# Patient Record
Sex: Male | Born: 1960 | Race: White | Hispanic: No | Smoking: Former smoker
Health system: Southern US, Community
[De-identification: ages and names within clinical notes are randomized; demographics above are authoritative.]

## PROBLEM LIST (undated history)

## (undated) DIAGNOSIS — K760 Fatty (change of) liver, not elsewhere classified: Secondary | ICD-10-CM

## (undated) DIAGNOSIS — Z87442 Personal history of urinary calculi: Secondary | ICD-10-CM

## (undated) DIAGNOSIS — IMO0001 Reserved for inherently not codable concepts without codable children: Secondary | ICD-10-CM

## (undated) DIAGNOSIS — N529 Male erectile dysfunction, unspecified: Secondary | ICD-10-CM

## (undated) DIAGNOSIS — K219 Gastro-esophageal reflux disease without esophagitis: Secondary | ICD-10-CM

## (undated) DIAGNOSIS — M1990 Unspecified osteoarthritis, unspecified site: Secondary | ICD-10-CM

## (undated) DIAGNOSIS — Z8739 Personal history of other diseases of the musculoskeletal system and connective tissue: Secondary | ICD-10-CM

## (undated) DIAGNOSIS — G5600 Carpal tunnel syndrome, unspecified upper limb: Secondary | ICD-10-CM

## (undated) HISTORY — DX: Fatty (change of) liver, not elsewhere classified: K76.0

## (undated) HISTORY — PX: BACK SURGERY: SHX140

## (undated) HISTORY — DX: Male erectile dysfunction, unspecified: N52.9

## (undated) HISTORY — DX: Personal history of other diseases of the musculoskeletal system and connective tissue: Z87.39

## (undated) HISTORY — PX: VASECTOMY: SHX75

## (undated) HISTORY — PX: SHOULDER ARTHROSCOPY: SHX128

## (undated) HISTORY — PX: CYSTOSCOPY: SUR368

## (undated) HISTORY — PX: NASAL POLYP SURGERY: SHX186

## (undated) HISTORY — PX: CHOLECYSTECTOMY: SHX55

---

## 2000-05-03 ENCOUNTER — Ambulatory Visit (HOSPITAL_COMMUNITY): Admission: RE | Admit: 2000-05-03 | Discharge: 2000-05-04 | Payer: Self-pay | Admitting: Neurosurgery

## 2000-05-03 ENCOUNTER — Encounter: Payer: Self-pay | Admitting: Neurosurgery

## 2003-01-23 ENCOUNTER — Inpatient Hospital Stay (HOSPITAL_COMMUNITY): Admission: EM | Admit: 2003-01-23 | Discharge: 2003-01-24 | Payer: Self-pay | Admitting: Internal Medicine

## 2003-02-05 ENCOUNTER — Ambulatory Visit (HOSPITAL_COMMUNITY): Admission: RE | Admit: 2003-02-05 | Discharge: 2003-02-05 | Payer: Self-pay | Admitting: Family Medicine

## 2003-02-20 ENCOUNTER — Emergency Department (HOSPITAL_COMMUNITY): Admission: EM | Admit: 2003-02-20 | Discharge: 2003-02-20 | Payer: Self-pay | Admitting: Emergency Medicine

## 2003-02-28 ENCOUNTER — Observation Stay (HOSPITAL_COMMUNITY): Admission: RE | Admit: 2003-02-28 | Discharge: 2003-03-01 | Payer: Self-pay | Admitting: Urology

## 2004-02-27 IMAGING — RF DG RETROGRADE PYELOGRAM
1 series · 3 of 3 positions shown · non-contrast
Comparison: none

CLINICAL DATA: Left distal ureteral calculus.  
 LEFT RETROGRADE PYELOGRAM
 Images were obtained of the distal most left ureter, which demonstrated no abnormality.  However, the mid and proximal left ureter and left pelvicaliceal system are not visualized on this study.
 IMPRESSION
 As above.

[Series 753: run · 3 of 3 slices shown]
[im 1/3]
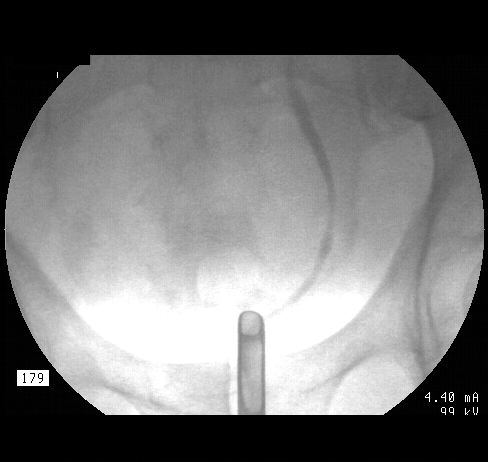
[im 2/3]
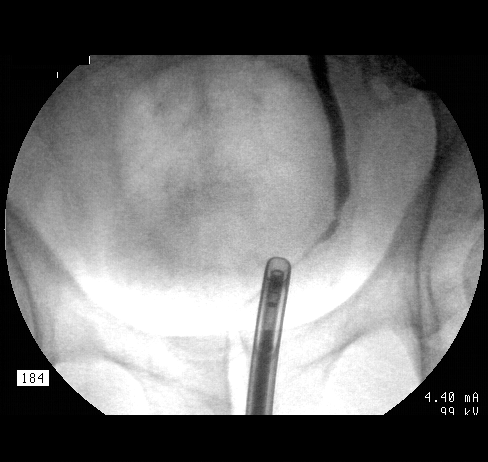
[im 3/3]
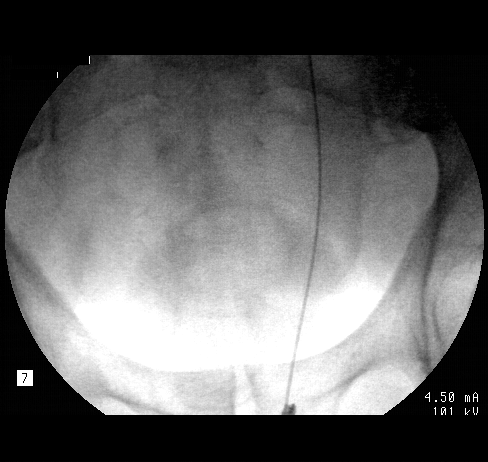

[3 of 3 positions shown; findings below may reference images not displayed]

## 2004-10-03 ENCOUNTER — Emergency Department (HOSPITAL_COMMUNITY): Admission: EM | Admit: 2004-10-03 | Discharge: 2004-10-03 | Payer: Self-pay | Admitting: Emergency Medicine

## 2004-10-25 ENCOUNTER — Ambulatory Visit (HOSPITAL_COMMUNITY): Admission: RE | Admit: 2004-10-25 | Discharge: 2004-10-25 | Payer: Self-pay | Admitting: Family Medicine

## 2005-02-02 ENCOUNTER — Ambulatory Visit (HOSPITAL_COMMUNITY): Admission: RE | Admit: 2005-02-02 | Discharge: 2005-02-03 | Payer: Self-pay | Admitting: Neurosurgery

## 2008-12-28 ENCOUNTER — Emergency Department (HOSPITAL_COMMUNITY): Admission: EM | Admit: 2008-12-28 | Discharge: 2008-12-28 | Payer: Self-pay | Admitting: Emergency Medicine

## 2009-05-04 ENCOUNTER — Ambulatory Visit (HOSPITAL_COMMUNITY): Admission: RE | Admit: 2009-05-04 | Discharge: 2009-05-04 | Payer: Self-pay | Admitting: Family Medicine

## 2009-05-08 ENCOUNTER — Encounter (HOSPITAL_COMMUNITY): Admission: RE | Admit: 2009-05-08 | Discharge: 2009-06-07 | Payer: Self-pay | Admitting: Family Medicine

## 2009-06-25 ENCOUNTER — Ambulatory Visit (HOSPITAL_COMMUNITY): Admission: RE | Admit: 2009-06-25 | Discharge: 2009-06-25 | Payer: Self-pay | Admitting: Internal Medicine

## 2009-07-29 ENCOUNTER — Ambulatory Visit (HOSPITAL_COMMUNITY): Admission: RE | Admit: 2009-07-29 | Discharge: 2009-07-29 | Payer: Self-pay | Admitting: General Surgery

## 2010-03-02 ENCOUNTER — Ambulatory Visit (HOSPITAL_COMMUNITY)
Admission: RE | Admit: 2010-03-02 | Discharge: 2010-03-02 | Payer: Self-pay | Source: Home / Self Care | Attending: Urology | Admitting: Urology

## 2010-03-05 ENCOUNTER — Ambulatory Visit (HOSPITAL_COMMUNITY)
Admission: RE | Admit: 2010-03-05 | Discharge: 2010-03-05 | Payer: Self-pay | Source: Home / Self Care | Attending: Urology | Admitting: Urology

## 2010-03-06 ENCOUNTER — Encounter: Payer: Self-pay | Admitting: Urology

## 2010-03-07 ENCOUNTER — Emergency Department (HOSPITAL_COMMUNITY)
Admission: EM | Admit: 2010-03-07 | Discharge: 2010-03-07 | Payer: Self-pay | Source: Home / Self Care | Admitting: Emergency Medicine

## 2010-03-08 LAB — SURGICAL PCR SCREEN: MRSA, PCR: NEGATIVE

## 2010-03-08 LAB — BASIC METABOLIC PANEL
BUN: 15 mg/dL (ref 6–23)
Creatinine, Ser: 1.32 mg/dL (ref 0.4–1.5)
GFR calc non Af Amer: 57 mL/min — ABNORMAL LOW (ref >60)
Glucose, Bld: 93 mg/dL (ref 70–99)
Potassium: 4.3 mEq/L (ref 3.5–5.1)

## 2010-03-09 LAB — BASIC METABOLIC PANEL
BUN: 14 mg/dL (ref 6–23)
Chloride: 103 mEq/L (ref 96–112)
Glucose, Bld: 94 mg/dL (ref 70–99)
Potassium: 4 mEq/L (ref 3.5–5.1)

## 2010-03-09 LAB — CBC
HCT: 39.4 % (ref 39.0–52.0)
MCV: 88.9 fL (ref 78.0–100.0)
RBC: 4.43 MIL/uL (ref 4.22–5.81)
RDW: 12.7 % (ref 11.5–15.5)
WBC: 10.4 10*3/uL (ref 4.0–10.5)

## 2010-03-09 LAB — DIFFERENTIAL
Eosinophils Relative: 3 % (ref 0–5)
Lymphocytes Relative: 28 % (ref 12–46)
Lymphs Abs: 2.9 10*3/uL (ref 0.7–4.0)

## 2010-03-09 LAB — URINALYSIS, ROUTINE W REFLEX MICROSCOPIC
Bilirubin Urine: NEGATIVE
Ketones, ur: NEGATIVE mg/dL
Urine Glucose, Fasting: NEGATIVE mg/dL
pH: 5.5 (ref 5.0–8.0)

## 2010-03-09 LAB — URINE MICROSCOPIC-ADD ON

## 2010-03-10 LAB — URINE CULTURE: Culture  Setup Time: 201201222258

## 2010-03-11 NOTE — Consult Note (Addendum)
  Daniel Conner, Daniel Conner NO.:  [id number]  MEDICAL RECORD NO.:  [id number]          PATIENT TYPE:  AMB  LOCATION:  DAY                           FACILITY:  [facility]  PHYSICIAN:  Ky Barban, M.D.DATE OF BIRTH:  03-14-1960  DATE OF CONSULTATION: DATE OF DISCHARGE:                                CONSULTATION   CHIEF COMPLAINT:  Recurrent right flank pain.  HISTORY:  A 50 year old gentleman who is well-known to me and I have taken care of him in the past.  I have done a stone basket sometime ago and he presented with right flank pain on January 16, he was seen in the office.  He is having pain ever since and CT scan shows he has a 4-mm stone.  The right upper ureter, CT scan showed there is no stone has moved into the distal right ureter and the ureterovesical junction causing right hydroureteronephrosis.  The patient wants me to go ahead and remove the stone, although I told them it is possibly he can pass it so I am going to wait couple of days, schedule him if he passed the stone, then we can cancel the procedure, otherwise we will go ahead with the stone basket on Friday.  Procedure risks, limitation, and complications were discussed in detail especially ureteral perforation leading to open surgery.  Use of double-J stent,  he understands, want me to go out and proceed.  PAST HISTORY:  Back surgery in 2005 and again in 2003.  Cholecystectomy in 2011.  On the CT scan, there are several lymph nodes in the retroperitoneum also and I think they need to be addressed.  I told him and his wife when we get rid of the stone after that we will talk about that they are going to remind me also.  FAMILY HISTORY:  No history of prostate cancer.  PERSONAL HISTORY:  He does not smoke but drinks very little.  REVIEW OF SYSTEMS:  Unremarkable.  PHYSICAL EXAMINATION:  GENERAL:  Well-nourished, well-developed male in moderate distress. VITAL SIGNS:  Blood pressure 130/81  and temperature 98.6. CENTRAL NERVOUS SYSTEM:  Negative. HEAD, NECK ENT:  Negative. CHEST:  Symmetrical. HEART:  Regular sinus rhythm, no murmur. ABDOMEN:  Soft and flat.  Liver, spleen, and kidneys are not palpable. 1+ right CVA tenderness. EXTERNAL GENITALIA:  Unremarkable. RECTAL:  Deferred. EXTREMITIES:  Normal.  IMPRESSION:  Right distal ureteral calculus.  PLAN:  Cystoscopy, right retrograde pyelogram, UV double-J stent ureteroscopic stone basket extraction with holmium laser lithotripsy.     Ky Barban, M.D.     MIJ/MEDQ  D:  03/04/2010  T:  03/05/2010  Job:  [id number]  Electronically Signed by Alleen Borne M.D. on 03/11/2010 04:54:26 PM

## 2010-03-11 NOTE — Op Note (Addendum)
  NAMENOHA, KARASIK NO.:  [id number]  MEDICAL RECORD NO.:  [id number]          PATIENT TYPE:  AMB  LOCATION:  DAY                           FACILITY:  [facility]  PHYSICIAN:  Ky Barban, M.D.DATE OF BIRTH:  04-02-1960  DATE OF PROCEDURE:  03/05/2010 DATE OF DISCHARGE:  03/05/2010                              OPERATIVE REPORT   PREOPERATIVE DIAGNOSIS:  Right ureteral calculus.  POSTOPERATIVE DIAGNOSIS:  Right ureteral calculus.  PROCEDURES:  Cystoscopy, right retrograde pyelogram, ureteroscopic stone basket extraction.  ANESTHESIA:  General.  PROCEDURE:  The patient under general anesthesia in lithotomy position. After usual prep and drape, #25 cystoscope introduced into the bladder which was inspected, looks normal.  Right ureteral orifice catheterized with a wedge catheter.  Hypaque was injected under fluoroscopic control. There was a filling defect in the ureterovesical junction which was the stone and guidewire was passed up into the renal pelvis and intramural ureter was dilated with #15 balloon.  After dilating the ureter and short rigid, a guidewire was already in place.  A short rigid ureteroscope was passed alongside the guidewire.  Stone was visualized, grabbed with the help of a basket and retrieved without any problem. The patient left the operating room in satisfactory condition.  I had decided not to leave any double-J stent.     Ky Barban, M.D.     MIJ/MEDQ  D:  03/05/2010  T:  03/06/2010  Job:  [id number]  Electronically Signed by Alleen Borne M.D. on 03/11/2010 04:54:29 PM

## 2010-05-03 LAB — BASIC METABOLIC PANEL
BUN: 11 mg/dL (ref 6–23)
CO2: 26 mEq/L (ref 19–32)
Chloride: 105 mEq/L (ref 96–112)
Creatinine, Ser: 0.77 mg/dL (ref 0.4–1.5)

## 2010-05-03 LAB — CBC
HCT: 40.7 % (ref 39.0–52.0)
MCHC: 34.8 g/dL (ref 30.0–36.0)
MCV: 91.3 fL (ref 78.0–100.0)
RBC: 4.45 MIL/uL (ref 4.22–5.81)

## 2010-05-03 LAB — SURGICAL PCR SCREEN

## 2010-07-02 NOTE — Discharge Summary (Signed)
NAME:  Daniel Conner, Daniel Conner NO.:  [id number]   MEDICAL RECORD NO.:  [id number]                   PATIENT TYPE:  INP   LOCATION:  A224                                 FACILITY:  [facility]   PHYSICIAN:  Donna Bernard, M.D.             DATE OF BIRTH:  06-18-1960   DATE OF ADMISSION:  01/23/2003  DATE OF DISCHARGE:  01/24/2003                                 DISCHARGE SUMMARY   FINAL DIAGNOSES:  1. Chest pain, myocardial infarction ruled out.  2. Probable reflux.  3. Elevated liver enzymes.   FINAL DISPOSITION:  1. The patient is discharged to home.  2. Stress test scheduled.   DISCHARGE MEDICATIONS:  Prilosec 20 mg daily.   HISTORY OF PRESENT ILLNESS:  Initial H&P:  Please see H&P as dictated.   HOSPITAL COURSE:  This patient is a 50 year old white male with a relatively  benign medical history who presented to the emergency room on the day of  admission with complaints of severe back and chest pain.  Pain was felt to  be atypical.  EKG was normal.  The patient does at times have some reflux  symptoms.  He did take a nitroglycerin in the emergency room which resulted  in improvement in pain.  The patient was admitted to the hospital, serial  cardiac enzymes were checked, these remained negative.  The patient was  given Protonix 40 mg daily to cover for any acid component.  Due to elevated  liver enzymes, a gallbladder of the liver was scheduled.  The results are  pending at the time of discharge.  Cardiac enzymes returned normal.  On the  day following admission, the patient was feeling better.  He was discharged  home with diagnoses and disposition as noted above.     ___________________________________________                                         Donna Bernard, M.D.   WSL/MEDQ  D:  02/12/2003  T:  02/12/2003  Job:  [id number]

## 2010-07-02 NOTE — Procedures (Signed)
NAME:  HARLIN, MAZZONI NO.:  [id number]   MEDICAL RECORD NO.:  [id number]                   PATIENT TYPE:  OUT   LOCATION:  DFTL                                 FACILITY:  [facility]   PHYSICIAN:  Donna Bernard, M.D.             DATE OF BIRTH:  1960/04/18   DATE OF PROCEDURE:  02/05/2003  DATE OF DISCHARGE:                                    STRESS TEST   INDICATION FOR TEST:  This patient is a [age]ish-year-old white male a  history of recent admission to the hospital for atypical chest and back  pain.  Stress test is done for risk stratification purposes and assessment  for potential coronary ischemia.   STRESS TEST:  Stress test was performed at standard Bruce protocol.  Resting  EKG revealed normal sinus rhythm with no significant ST-T changes.  The  patient tolerated the first two stages well.  Midway through the third stage  the patient surpassed a submax predicted heart rate of 150.  He had a  predictable hypertensive response achieving a peak systolic of 210/94.  The  patient experienced no chest discomfort.  He obviously became quite short of  breath at his maximum heart rate of 156.  At 0.08 seconds past the J point  there were no significant ST-T changes noted during rest phase.  The ST did  depress somewhat but with a sharply ascending slope.   IMPRESSION:  Negative adequate stress test.   PLAN:  Regular exercise discussed and encouraged with the patient.      ___________________________________________                                            Donna Bernard, M.D.   WSL/MEDQ  D:  02/05/2003  T:  02/06/2003  Job:  [id number]

## 2010-07-02 NOTE — Op Note (Signed)
NAME:  Daniel Conner, Daniel Conner                          ACCOUNT NO.:  [id number]   MEDICAL RECORD NO.:  [id number]                   PATIENT TYPE:  AMB   LOCATION:  DAY                                  FACILITY:  [facility]   PHYSICIAN:  Ky Barban, M.D.            DATE OF BIRTH:  04/15/1960   DATE OF PROCEDURE:  DATE OF DISCHARGE:                                 OPERATIVE REPORT   PREOPERATIVE DIAGNOSIS:  Left ureteral calculus.   POSTOPERATIVE DIAGNOSIS:  Left ureteral calculus.   PROCEDURE:  Cystoscopy, left retrograde pyelogram, left ureteroscopy.   ANESTHESIA:  General.   DESCRIPTION OF PROCEDURE:  The patient was given general endotracheal  anesthesia and placed in the lithotomy position after sterile prep and  drape.   A #25 cystoscope was introduced into the bladder.  It was inspected and  looked normal.  The left ureteral orifice was catheterized with a wedge  catheter.  Hypaque was injected.  There appeared to be a filling defect in  the ureterovesical junction, and the ureter above that was dilated.  A  guidewire was passed up into the renal pelvis and the cystoscope was  removed.  I also inspected his prostatic urethra.  His prostate was enlarged  with a lot of inflammation in his prostate.  This was causing bladder neck  obstruction.  The short rigid ureteroscope was introduced over the  guidewire.  I looked into the ureterovesical junction, and it looked fine.  There was no stone, and the ureter above that was also normal.  It appeared  that there was a lot of cloudy urine in the upper ureter.  It is possible  that the stone has migrated up into the renal pelvis or he might have passed  the stone.  I looked into the ureter all the way up.  No stone was seen.  All of instruments were removed.  The bladder was emptied.   The patient left the operating room in satisfactory condition.      ___________________________________________      Ky Barban, M.D.   MIJ/MEDQ  D:  02/28/2003  T:  02/28/2003  Job:  [id number]

## 2010-07-02 NOTE — H&P (Signed)
NAME:  Daniel Conner, Daniel Conner                          ACCOUNT NO.:  [id number]   MEDICAL RECORD NO.:  [id number]                   PATIENT TYPE:  AMB   LOCATION:  DAY                                  FACILITY:  [facility]   PHYSICIAN:  Ky Barban, M.D.            DATE OF BIRTH:  Aug 25, 1960   DATE OF ADMISSION:  DATE OF DISCHARGE:                                HISTORY & PHYSICAL   CHIEF COMPLAINT:  Recurrent left renal colic.   HISTORY:  This is a 50 year old gentleman who came to see me in the office  on February 21, 2003, with left renal colic.  He had to go to the emergency  room a couple of days before that where a CT scan showed 2 x 4 mm stone in  the distal left ureter with no hydronephrosis.  At that time I advised him  that he should wait and see if he can pass the stone.  The next week he came  back again.  He continues to have pain, and he wants something done about  it.  I discussed with him about doing a stone basket, and the procedure was  discussed in detail.  No guarantees given about the results, and  complications such as ureteral perforation leading to open surgery was also  discussed, and use of double-J stent was discussed.  He wants me to go ahead  and try, so he is coming as an outpatient to have cystoscopy, left  retrograde pyelogram, ureteroscopic stone basket, and I may have to use  holmium laser to break the stone.   PAST HISTORY:  No history of diabetes or hypertension.  Back surgery a  couple of years ago.   MEDICATIONS:  Hydrocodone.   PERSONAL HISTORY:  Does not smoke or drink.   REVIEW OF SYSTEMS:  Unremarkable.  It should be mentioned that he did have a  stone basket done in the past several years ago.  He is familiar with the  procedure.   EXAMINATION:  GENERAL:  Moderately obese.  Not in any acute distress.  Fully  conscious, alert, oriented.  CENTRAL NERVOUS SYSTEM:  Negative.  HEENT:  Negative.  CHEST:  Symmetrical.  HEART:  Regular sinus rhythm.   No murmur.  ABDOMEN:  Soft, flat.  Liver, spleen, and kidneys are not palpable.  No CVA  tenderness.  GENITOURINARY:  External genitalia is circumcised, meatus is adequate,  testicles are normal.  RECTAL:  Prostate 1.5+, smooth and firm.   IMPRESSION:  Left ureteral calculus.   PLAN:  Cystoscopy, left retrograde pyelogram, ureteroscopic holmium laser  lithotripsy, stone basket, insertion of double-J stent under anesthesia as  outpatient.     ___________________________________________  Ky Barban, M.D.   MIJ/MEDQ  D:  02/27/2003  T:  02/27/2003  Job:  [id number]

## 2010-07-02 NOTE — Procedures (Signed)
NAMEBODE, PIEPER NO.:  [id number]   MEDICAL RECORD NO.:  [id number]                  PATIENT TYPE:   LOCATION:                                       FACILITY:   PHYSICIAN:  Donna Bernard, M.D.             DATE OF BIRTH:   DATE OF PROCEDURE:  DATE OF DISCHARGE:                                EKG INTERPRETATION   FINDINGS:  EKG reveals normal sinus rhythm with no significant ST-T changes.      ___________________________________________                                            Donna Bernard, M.D.   WSL/MEDQ  D:  02/12/2003  T:  02/12/2003  Job:  [id number]

## 2010-07-02 NOTE — H&P (Signed)
Daniel Conner, PICKNEY NO.:  [id number]   MEDICAL RECORD NO.:  [id number]                  PATIENT TYPE:   LOCATION:                                       FACILITY:   PHYSICIAN:  Donna Bernard, M.D.             DATE OF BIRTH:  [birth year]   DATE OF ADMISSION:  DATE OF DISCHARGE:                                HISTORY & PHYSICAL   CHIEF COMPLAINT:  Back pain, chest pain.   HISTORY OF PRESENT ILLNESS:  This patient is a 50 year old white male with  relatively benign prior medical history, who presented to the emergency room  on the day of admission with the complaints of severe back and chest pain.  The patient was driving to work when he suddenly developed a deep ache in  his back, radiating to his chest.  He had no nausea, no diaphoresis.  He did  experience some slight shortness of breath and related at times with a deep  breath, the pain seemed to be worse.  The patient gives no recent history of  any prior pain.  He works as a Nature conservation officer.  At times, he does have to do some  significant lifting.  He also notes that at times he will have severe aches  develop in muscles in his body, but has never had this in his back or chest.  There appears to be no association with meals.  He has had no diarrhea, no  vomiting, no change in urinary habits.  The patient presents to the  emergency room, was seen in the ER.  Of note, when he took nitroglycerin, he  seemed to feel a little better.  When he arrived in the emergency room, he  noted the pain was 7-8/10.  At this point, he notes a 2-3/10.   FAMILY HISTORY:  Positive for colon cancer.  He has a brother recently  identified with coronary artery disease that required stenting.   ALLERGIES:  None known.   SOCIAL HISTORY:  The patient is married.  No significant alcohol abuse.  No  tobacco use.   PAST SURGICAL HISTORY:  Positive for laminectomy L5/S1 and level 2.   PAST MEDICAL HISTORY:  Also significant  for kidney stones.   REVIEW OF SYSTEMS:  Otherwise, negative.   PHYSICAL EXAMINATION:  VITAL SIGNS:  Blood pressure 144/90.  GENERAL:  Significant obesity noted.  No acute distress.  HEENT:  Normal.  NECK:  Supple.  LUNGS:  Clear.  HEART:  Regular rate and rhythm, no chest wall tenderness.  ABDOMEN:  Large, no rebound, no guarding, no obvious tenderness, no mass.  EXTREMITIES:  Normal.  Old laminectomy scar noted.  NEUROLOGIC:  Intact.   STUDIES:  EKG:  Normal sinus rhythm, no significant ST-T changes.  Chest x-  ray unremarkable.   LABORATORY DATA:  Cardiac enzymes negative.   IMPRESSION:  Back and chest pain.  Doubt cardiac.  However, the patient's  history and personal risk factors along with the description of pain, workup  for potential cardiac etiology is warranted.   PLAN:  Telemetry, serial cardiac enzymes, gallbladder ultrasound.  Further  orders as noted on the chart.  Will schedule a stress test as an outpatient.     ___________________________________________                                         Donna Bernard, M.D.   WSL/MEDQ  D:  01/24/2003  T:  01/25/2003  Job:  [id number]

## 2010-07-02 NOTE — Op Note (Signed)
. [facility]  Patient:    Daniel Conner, Daniel Conner                         MRN: [id number] Proc. Date: 05/05/00 Adm. Date:  [id number] Disc. Date: [id number] Attending:  Coletta Memos                           Operative Report  PREOPERATIVE DIAGNOSES: 1. Right L5-S1 displaced disk. 2. Right S1 radiculopathy.  POSTOPERATIVE DIAGNOSES: 1. Right L5-S1 displaced disk. 2. Right S1 radiculopathy.  PROCEDURE:  Right L5-S1 semihemilaminectomy and diskectomy with microdissection.  COMPLICATIONS:  None.  SURGEON:  Kyle L. Franky Macho, M.D.  ASSISTANT:  Danae Orleans. Venetia Maxon, M.D.  ANESTHESIA:  General endotracheal anesthesia.  INDICATIONS:  Daniel Conner is a 50 year old gentleman who initially presented with pain in the right lower extremity and back.  Conservative treatment was not helpful; therefore, we recommended he agree to undergo lumbar laminectomy and diskectomy.  DESCRIPTION OF PROCEDURE:  The patient was brought to the operating room, intubated, placed under general anesthesia without difficulty.  His back was prepped and he was draped in the sterile fashion.  Took a preoperative localization x-ray and it showed me where to make the incision.  I made that incision, took this down to the thoracolumbar fascia which was opened in a subperiosteal fashion, exposing the lamina of L5 and S1.  I brought the microscope into the exposure. I also placed a probe anterior to the superior-most lamina and showed that I was at L5-S1.  I then opened a ligament using Kerrison punches and exposed the thecal sac and the S1 nerve root.  I retracted that medially and started the diskectomy along with the assistance of Dr. Maeola Harman.  I did have to remove a small portion of the bone at L5 using a high-speed drill and this was done in order to adequately decompress the S1 nerve root.  Disk space was opened with a 15 blade.  I removed disk material with curets and pituitary  rongeurs.  After adequate decompression was done, the nerve root was inspected. There was no pressure felt on the nerve in the lateral or medial direction.  I irrigated the wound, then closed the wound in a layered fascia with Vicryl sutures.  The patient had the fascia reapproximated with Vicryl sutures as well as the subcutaneous tissues.  Skin reapproximated with Dermabond.  The patient tolerated the procedure well, was laid supine, extubated and moving all extremities. DD:  07/27/00 TD:  07/27/00 Job: [id number]

## 2010-07-02 NOTE — Op Note (Signed)
Daniel Conner, CHONG NO.:  [id number]   MEDICAL RECORD NO.:  [id number]          PATIENT TYPE:  OIB   LOCATION:  3013                         FACILITY:  [facility]   PHYSICIAN:  Coletta Memos, M.D.     DATE OF BIRTH:  1960-09-11   DATE OF PROCEDURE:  02/02/2005  DATE OF DISCHARGE:  02/03/2005                                 OPERATIVE REPORT   OPERATIVE PROCEDURE:  L4-5 diskectomy.   PREOPERATIVE DIAGNOSIS:  1.  Displaced disk left L4-5.  2.  Left L5 radiculopathy.   POSTOPERATIVE DIAGNOSIS:  1.  Displaced disk left L4-5.  2.  Left L5 radiculopathy.   PROCEDURE:  Left L4-5 semi-hemilaminectomy and diskectomy with  microdissection.   COMPLICATIONS:  None.   SURGEON:  Coletta Memos, M.D. and Stefani Dama, M.D.   COMPLICATIONS:  None.   INDICATIONS:  Sirr Kabel is a gentleman whom I had seen with severe pain  in left lower extremity. He had fairly large disk herniation on the left  side at L4-5. He had a right-sided disk at L5-S1. He also appeared to have  some far lateral narrowing which may have been due to the disk at L3-4.  However, most of his symptoms seem to be referable to the L4-5 space. I  therefore recommended and he agreed to undergo left L4-5 diskectomy and  decompression.   OPERATIVE NOTE:  Mr. Arvelo was brought to the operating room, intubated,  and placed under general anesthetic without difficulty. Rolled prone onto a  Wilson frame and all pressure points were properly padded. His back was  prepped and he was draped in a sterile fashion. I infiltrated 0.5% lidocaine  1:200,000 strength epinephrine into the lumbar region. I opened the skin and  took this down to the thoracolumbar fascia. I then exposed the lamina of L3,  L4, and L5. I then performed a semi-hemilaminectomy of L4 using a high-speed  drill. I removed ligamentum flavum and exposed the thecal sac between L4-L5  on the left side. I brought the microscope in to aid in  microdissection and  retracted thecal sac medially. I then opened the disk space with a #15 blade  and removed a great deal of disk material. I did this until I had fully  decompressed the nerve root. At this point time after what I removed from  the L4-5 disk space, I did not believe that the 3-4 lateral disk was a  problem. I therefore did not violate that  disk space. I did fully decompress both the L4 and L5 roots. I did this with  Dr. Verlee Rossetti assistance. We then irrigated the wound. I then closed in a  layered fashion using Vicryl sutures. Dermabond used for sterile dressing.  The patient tolerated procedure well without difficulty.           ______________________________  Coletta Memos, M.D.     KC/MEDQ  D:  02/16/2005  T:  02/17/2005  Job:  [id number]

## 2011-03-30 ENCOUNTER — Other Ambulatory Visit: Payer: Self-pay | Admitting: Family Medicine

## 2011-03-30 DIAGNOSIS — R52 Pain, unspecified: Secondary | ICD-10-CM

## 2011-03-31 ENCOUNTER — Ambulatory Visit (HOSPITAL_COMMUNITY)
Admission: RE | Admit: 2011-03-31 | Discharge: 2011-03-31 | Disposition: A | Discharge status: Home / Self Care | Payer: BC Managed Care – PPO | Source: Ambulatory Visit | Attending: Family Medicine | Admitting: Family Medicine

## 2011-03-31 DIAGNOSIS — M67919 Unspecified disorder of synovium and tendon, unspecified shoulder: Secondary | ICD-10-CM | POA: Insufficient documentation

## 2011-03-31 DIAGNOSIS — M25519 Pain in unspecified shoulder: Secondary | ICD-10-CM | POA: Insufficient documentation

## 2011-03-31 DIAGNOSIS — M6740 Ganglion, unspecified site: Secondary | ICD-10-CM | POA: Insufficient documentation

## 2011-03-31 DIAGNOSIS — R52 Pain, unspecified: Secondary | ICD-10-CM

## 2011-03-31 DIAGNOSIS — M25619 Stiffness of unspecified shoulder, not elsewhere classified: Secondary | ICD-10-CM | POA: Insufficient documentation

## 2011-03-31 DIAGNOSIS — M719 Bursopathy, unspecified: Secondary | ICD-10-CM | POA: Insufficient documentation

## 2012-03-20 ENCOUNTER — Emergency Department (HOSPITAL_COMMUNITY)
Admission: EM | Admit: 2012-03-20 | Discharge: 2012-03-20 | Disposition: A | Discharge status: Home / Self Care | Payer: BC Managed Care – PPO | Attending: Emergency Medicine | Admitting: Emergency Medicine

## 2012-03-20 ENCOUNTER — Encounter (HOSPITAL_COMMUNITY): Payer: Self-pay | Admitting: *Deleted

## 2012-03-20 DIAGNOSIS — Z87442 Personal history of urinary calculi: Secondary | ICD-10-CM | POA: Insufficient documentation

## 2012-03-20 DIAGNOSIS — S61409A Unspecified open wound of unspecified hand, initial encounter: Secondary | ICD-10-CM | POA: Insufficient documentation

## 2012-03-20 DIAGNOSIS — W268XXA Contact with other sharp object(s), not elsewhere classified, initial encounter: Secondary | ICD-10-CM | POA: Insufficient documentation

## 2012-03-20 DIAGNOSIS — Y93E5 Activity, floor mopping and cleaning: Secondary | ICD-10-CM | POA: Insufficient documentation

## 2012-03-20 DIAGNOSIS — S61419A Laceration without foreign body of unspecified hand, initial encounter: Secondary | ICD-10-CM

## 2012-03-20 DIAGNOSIS — Y92009 Unspecified place in unspecified non-institutional (private) residence as the place of occurrence of the external cause: Secondary | ICD-10-CM | POA: Insufficient documentation

## 2012-03-20 NOTE — ED Provider Notes (Signed)
Medical screening examination/treatment/procedure(s) were performed by non-physician practitioner and as supervising physician I was immediately available for consultation/collaboration.   Carleene Cooper III, MD 03/20/12 2013

## 2012-03-20 NOTE — ED Notes (Signed)
Instructions reviewed and f/u information provided-verbalizes understanding.   

## 2012-03-20 NOTE — ED Provider Notes (Signed)
History     CSN: [id number]  Arrival date & time 03/20/12  1017   First MD Initiated Contact with Patient 03/20/12 1057      Chief Complaint  Patient presents with  . Laceration    (Consider location/radiation/quality/duration/timing/severity/associated sxs/prior treatment) HPI Comments: Patient c/o laceration to the left hand that occurred while washing dishes and a drinking glass broke in his hand.  He denies numbness or weakness to the fingers.  States Td is UTD.    Patient is a 52 y.o. male presenting with skin laceration. The history is provided by the patient.  Laceration  The incident occurred less than 1 hour ago. The laceration is located on the left hand. The laceration is 3 cm in size. The laceration mechanism was a broken glass. The patient is experiencing no pain. He reports no foreign bodies present. His tetanus status is UTD.    Past Medical History  Diagnosis Date  . Renal disorder     kidney stones    Past Surgical History  Procedure Date  . Back surgery   . Cholecystectomy     No family history on file.  History  Substance Use Topics  . Smoking status: Never Smoker   . Smokeless tobacco: Not on file  . Alcohol Use: No      Review of Systems  Constitutional: Negative for fever and chills.  Musculoskeletal: Negative for back pain, joint swelling and arthralgias.  Skin: Positive for wound.       Laceration   Neurological: Negative for dizziness, weakness and numbness.  Hematological: Does not bruise/bleed easily.  All other systems reviewed and are negative.    Allergies  Hydrocodone  Home Medications   Current Outpatient Rx  Name  Route  Sig  Dispense  Refill  . ADULT MULTIVITAMIN W/MINERALS CH   Oral   Take 1 tablet by mouth daily.           BP 160/92  Pulse 77  Temp 98.3 F (36.8 C) (Oral)  Resp 20  Ht 5\' 11"  (1.803 m)  Wt 268 lb (121.564 kg)  BMI 37.38 kg/m2  SpO2 98%  Physical Exam  Nursing note and vitals  reviewed. Constitutional: He is oriented to person, place, and time. He appears well-developed and well-nourished. No distress.  HENT:  Head: Normocephalic and atraumatic.  Cardiovascular: Normal rate, regular rhythm and normal heart sounds.   Pulmonary/Chest: Effort normal and breath sounds normal.  Musculoskeletal: He exhibits no edema and no tenderness.       Left hand: He exhibits laceration. He exhibits normal range of motion, no tenderness, no bony tenderness, normal two-point discrimination, normal capillary refill, no deformity and no swelling. normal sensation noted. Normal strength noted. He exhibits no finger abduction, no thumb/finger opposition and no wrist extension trouble.       Hands:      Three very superficial lacerations to the left palm with longest one at 3 cm, other two are < 1 cm in length.   Bleeding controlled.  Wounds cleaned and examined, no FB's seen or palpated.  Pt has full ROM of the fingers.  Radial pulse is brisk, distal sensation intact.    Neurological: He is alert and oriented to person, place, and time. He exhibits normal muscle tone. Coordination normal.  Skin: Laceration noted.       See ms exam    ED Course  Procedures (including critical care time)  Labs Reviewed - No data to display No results found.  MDM     Lacerations were cleaned by me.  Steri-strips applied by nursing staff.  Dressing also applied.  Patient agrees to return here if any signs of infection.  Tylenol or ibuprofen if needed for pain     Holmes Hays L. Shakyla Nolley, PA 03/20/12 1120

## 2012-03-20 NOTE — ED Notes (Signed)
Superficial laceration to left hand - states glass broke while washing dishes this morning, which cut his hand

## 2014-01-20 ENCOUNTER — Encounter (INDEPENDENT_AMBULATORY_CARE_PROVIDER_SITE_OTHER): Payer: Self-pay | Admitting: *Deleted

## 2014-05-12 ENCOUNTER — Telehealth: Payer: Self-pay | Admitting: *Deleted

## 2014-05-12 NOTE — Telephone Encounter (Signed)
Pt having low back pain that radiates to left leg. Started yesterday.  Per Dr. Lorin PicketScott, he can take anti inflammatory medicine, low back exercises, apply cold compresses. He did not want to try that, he wanted to be seen. I then transferred him up front to schedule OV tomorrow per Dr. Lorin PicketScott.

## 2014-05-13 ENCOUNTER — Ambulatory Visit (INDEPENDENT_AMBULATORY_CARE_PROVIDER_SITE_OTHER): Payer: BLUE CROSS/BLUE SHIELD | Admitting: Nurse Practitioner

## 2014-05-13 ENCOUNTER — Encounter: Payer: Self-pay | Admitting: Nurse Practitioner

## 2014-05-13 ENCOUNTER — Encounter: Payer: Self-pay | Admitting: Family Medicine

## 2014-05-13 VITALS — BP 142/88 | Ht 70.5 in | Wt 296.0 lb

## 2014-05-13 DIAGNOSIS — M5442 Lumbago with sciatica, left side: Secondary | ICD-10-CM

## 2014-05-13 MED ORDER — PREDNISONE 20 MG PO TABS: ORAL_TABLET | ORAL | Status: DC

## 2014-05-13 MED ORDER — OXYCODONE-ACETAMINOPHEN 5-325 MG PO TABS: 1.0000 | ORAL_TABLET | ORAL | Status: DC | PRN

## 2014-05-13 NOTE — Progress Notes (Signed)
Subjective:  Presents for c/o left low back pain radiating into left buttock down to the foot. Foot slightly numb. Has had 2 lumbar surgeries; has not seen Dr. Franky Machoabbell in several years. Has off/on pain only lasting a day or so, concerned because this is persistent pain x 4 d. No specific history of injury. Works for a [employer], very active job working 12 hour shifts. No relief with Ibuprofen. Worse with prolonged sitting or standing. No change in bowel or bladder habits. Does have claustrophobia; has taken medicine for MRI to help this with minimal relief.   Objective:   BP 142/88 mmHg  Ht 5' 10.5" (1.791 m)  Wt 296 lb (134.265 kg)  BMI 41.86 kg/m2 NAD. Alert, oriented. Lungs clear. Heart RRR. Tenderness to palpation of left lumbar area. SLR neg right; positive left. Patellar and achilles reflexes 4+ right; 3+ left. Gait normal but slow. Sits toward right hip to avoid pressure on left buttock.  Assessment: Left-sided low back pain with left-sided sciatica - Plan: MR Lumbar Spine Wo Contrast  Plan:  Meds ordered this encounter  Medications  . loratadine-pseudoephedrine (CLARITIN-D 24-HOUR) 10-240 MG per 24 hr tablet    Sig: Take 1 tablet by mouth daily as needed for allergies.  . Omega-3 Fatty Acids (FISH OIL PO)    Sig: Take 1 capsule by mouth daily.  . predniSONE (DELTASONE) 20 MG tablet    Sig: 3 po qd x 3 d then 2 po qd x 3 d then 1 po qd x 3 d    Dispense:  18 tablet    Refill:  0    Order Specific Question:  Supervising Provider    Answer:  Merlyn AlbertLUKING, WILLIAM S [[id number]]  . oxyCODONE-acetaminophen (PERCOCET/ROXICET) 5-325 MG per tablet    Sig: Take 1 tablet by mouth every 4 (four) hours as needed for severe pain.    Dispense:  20 tablet    Refill:  0    Order Specific Question:  Supervising Provider    Answer:  Merlyn AlbertLUKING, WILLIAM S [[id number]]   Refer to Dr. Franky Machoabbell once MRI report is available. Call back sooner if worse. Ice/heat to affected area. Work note given. Open MRI preferred if  covered.

## 2014-05-17 ENCOUNTER — Encounter: Payer: Self-pay | Admitting: *Deleted

## 2014-05-19 ENCOUNTER — Telehealth: Payer: Self-pay | Admitting: Family Medicine

## 2014-05-19 MED ORDER — OXYCODONE-ACETAMINOPHEN 5-325 MG PO TABS: 1.0000 | ORAL_TABLET | ORAL | Status: DC | PRN

## 2014-05-19 NOTE — Telephone Encounter (Signed)
Numb 40 one q 6 hr prn

## 2014-05-19 NOTE — Telephone Encounter (Signed)
Rx up front for patient pick up. Patient notified. 

## 2014-05-19 NOTE — Telephone Encounter (Signed)
Pt is requesting a refill on his pain medicine.

## 2014-05-19 NOTE — Telephone Encounter (Signed)
Patent got Percocet  5/325 #20 on 05/13/14

## 2014-05-26 ENCOUNTER — Telehealth: Payer: Self-pay | Admitting: Nurse Practitioner

## 2014-05-26 DIAGNOSIS — M549 Dorsalgia, unspecified: Secondary | ICD-10-CM

## 2014-05-26 NOTE — Telephone Encounter (Signed)
[facility] before last.

## 2014-05-26 NOTE — Telephone Encounter (Signed)
When did he have it done? Do not see results in system? Was it done outside of Epic?

## 2014-05-26 NOTE — Telephone Encounter (Signed)
[facility] Moderate Degenerative  Changes, buldging disc, arthrititis, recommend referral back to specialist that did back surgery.

## 2014-05-26 NOTE — Telephone Encounter (Signed)
Pt is wanting to know the results of his mri.

## 2014-05-27 NOTE — Telephone Encounter (Signed)
Discussed with pt. Dr. Franky Machoabbell did back surgery. Referral put in for Dr. Franky Machoabbell. Pt states he was suppose to return to work yesterday and he needs note for yesterday and he cannot return until after seeing specialist.

## 2014-05-27 NOTE — Telephone Encounter (Signed)
Ok lets do 

## 2014-06-02 ENCOUNTER — Telehealth: Payer: Self-pay | Admitting: Nurse Practitioner

## 2014-06-02 ENCOUNTER — Encounter: Payer: Self-pay | Admitting: Family Medicine

## 2014-06-02 ENCOUNTER — Other Ambulatory Visit: Payer: Self-pay | Admitting: Nurse Practitioner

## 2014-06-02 MED ORDER — OXYCODONE-ACETAMINOPHEN 5-325 MG PO TABS: 1.0000 | ORAL_TABLET | ORAL | Status: DC | PRN

## 2014-06-02 NOTE — Telephone Encounter (Signed)
oxyCODONE-acetaminophen (PERCOCET/ROXICET) 5-325 MG per tablet  Pt is still not set up with the specialist, can we please write him another script for his pain meds?

## 2014-06-02 NOTE — Telephone Encounter (Signed)
Rx up front for patient pick up. Patient notified. 

## 2014-06-02 NOTE — Telephone Encounter (Signed)
Patient got #40 05/19/14

## 2014-06-02 NOTE — Telephone Encounter (Signed)
Referral is in the works; will print one more Rx for pain med

## 2014-06-04 ENCOUNTER — Telehealth: Payer: Self-pay | Admitting: Family Medicine

## 2014-06-04 NOTE — Telephone Encounter (Signed)
Patient has called twice checking on disability/FMLA forms to see if they were ready. I explained to him you were out of the office that week he brought them but they were on your desk for review and to be signed. Please do as soon as possible.thanks

## 2014-06-04 NOTE — Telephone Encounter (Signed)
Will come by later to get this done. Thanks.

## 2014-06-12 DIAGNOSIS — Z029 Encounter for administrative examinations, unspecified: Secondary | ICD-10-CM

## 2014-06-20 ENCOUNTER — Encounter: Payer: Self-pay | Admitting: Nurse Practitioner

## 2014-06-23 ENCOUNTER — Telehealth: Payer: Self-pay | Admitting: Family Medicine

## 2014-06-23 ENCOUNTER — Encounter: Payer: Self-pay | Admitting: Family Medicine

## 2014-06-23 NOTE — Telephone Encounter (Signed)
ok 

## 2014-06-23 NOTE — Telephone Encounter (Signed)
Daniel Conner,  Mr Daniel Conner states he can't get into his specialist till the 11th, he want's to know  If you will extend his work note from 5/5 - 5/10  Please advise

## 2014-06-24 ENCOUNTER — Other Ambulatory Visit: Payer: Self-pay | Admitting: Neurosurgery

## 2014-07-02 ENCOUNTER — Encounter (HOSPITAL_COMMUNITY): Payer: Self-pay

## 2014-07-02 ENCOUNTER — Encounter (HOSPITAL_COMMUNITY)
Admission: RE | Admit: 2014-07-02 | Discharge: 2014-07-02 | Disposition: A | Discharge status: Home / Self Care | Payer: BLUE CROSS/BLUE SHIELD | Source: Ambulatory Visit | Attending: Neurosurgery | Admitting: Neurosurgery

## 2014-07-02 DIAGNOSIS — Z6841 Body Mass Index (BMI) 40.0 and over, adult: Secondary | ICD-10-CM | POA: Diagnosis not present

## 2014-07-02 DIAGNOSIS — Z9852 Vasectomy status: Secondary | ICD-10-CM | POA: Diagnosis not present

## 2014-07-02 DIAGNOSIS — Z886 Allergy status to analgesic agent status: Secondary | ICD-10-CM | POA: Diagnosis not present

## 2014-07-02 DIAGNOSIS — Z9049 Acquired absence of other specified parts of digestive tract: Secondary | ICD-10-CM | POA: Diagnosis not present

## 2014-07-02 DIAGNOSIS — N529 Male erectile dysfunction, unspecified: Secondary | ICD-10-CM | POA: Diagnosis not present

## 2014-07-02 DIAGNOSIS — M5126 Other intervertebral disc displacement, lumbar region: Secondary | ICD-10-CM | POA: Diagnosis present

## 2014-07-02 DIAGNOSIS — K219 Gastro-esophageal reflux disease without esophagitis: Secondary | ICD-10-CM | POA: Diagnosis not present

## 2014-07-02 HISTORY — DX: Gastro-esophageal reflux disease without esophagitis: K21.9

## 2014-07-02 LAB — COMPREHENSIVE METABOLIC PANEL
ALBUMIN: 3.8 g/dL (ref 3.5–5.0)
ALK PHOS: 67 U/L (ref 38–126)
ALT: 99 U/L — ABNORMAL HIGH (ref 17–63)
AST: 60 U/L — AB (ref 15–41)
Anion gap: 8 (ref 5–15)
BUN: 8 mg/dL (ref 6–20)
CO2: 26 mmol/L (ref 22–32)
Calcium: 9.6 mg/dL (ref 8.9–10.3)
Chloride: 106 mmol/L (ref 101–111)
Creatinine, Ser: 0.96 mg/dL (ref 0.61–1.24)
GFR calc Af Amer: >60 mL/min (ref >60)
GFR calc non Af Amer: >60 mL/min (ref >60)
Glucose, Bld: 162 mg/dL — ABNORMAL HIGH (ref 65–99)
Potassium: 4 mmol/L (ref 3.5–5.1)
Sodium: 140 mmol/L (ref 135–145)
Total Bilirubin: 0.6 mg/dL (ref 0.3–1.2)
Total Protein: 7.7 g/dL (ref 6.5–8.1)

## 2014-07-02 LAB — CBC
HCT: 43 % (ref 39.0–52.0)
Hemoglobin: 14.8 g/dL (ref 13.0–17.0)
MCH: 30.8 pg (ref 26.0–34.0)
MCHC: 34.4 g/dL (ref 30.0–36.0)
MCV: 89.4 fL (ref 78.0–100.0)
Platelets: 193 10*3/uL (ref 150–400)
RBC: 4.81 MIL/uL (ref 4.22–5.81)
RDW: 12.8 % (ref 11.5–15.5)
WBC: 7.4 10*3/uL (ref 4.0–10.5)

## 2014-07-02 LAB — SURGICAL PCR SCREEN
MRSA, PCR: NEGATIVE
STAPHYLOCOCCUS AUREUS: POSITIVE — AB

## 2014-07-02 NOTE — Progress Notes (Signed)
SPOKE WITH SUSAN AT OFFICE AND ASKED THAT SHE GIVE MESSAGE FOR ORDERS TO BE SIGNED.

## 2014-07-02 NOTE — Progress Notes (Signed)
Mupirocin Ointment Rx called into [facility], [address] [place] for positive PCR of staph. Pt notified and voiced understanding.

## 2014-07-02 NOTE — Progress Notes (Signed)
   07/02/14 1208  OBSTRUCTIVE SLEEP APNEA  Have you ever been diagnosed with sleep apnea through a sleep study? No  Do you snore loudly (loud enough to be heard through closed doors)?  0  Do you often feel tired, fatigued, or sleepy during the daytime? 0  Has anyone observed you stop breathing during your sleep? 0  Do you have, or are you being treated for high blood pressure? 0  BMI more than 35 kg/m2? 1  Age over 878 years old? 1  Neck circumference greater than 40 cm/16 inches? 1  Gender: 1

## 2014-07-02 NOTE — Pre-Procedure Instructions (Signed)
Daniel Conner  07/02/2014   Your procedure is scheduled on:   Friday  07/04/14  [facility] at 1200 PM.  Call this number if you have problems the morning of surgery: [phone]   Remember:   Do not eat food or drink liquids after midnight.   Take these medicines the morning of surgery with A SIP OF WATER:    OXYCODONE IF NEEDED  (STOP ASPIRIN, COUMADIN, PLAVIX, EFFIENT, HERBAL MEDICINES, MILTIVITAMIN, OMEGA3 FISH OIL)   Do not wear jewelry, make-up or nail polish.  Do not wear lotions, powders, or perfumes. You may wear deodorant.  Do not shave 48 hours prior to surgery. Men may shave face and neck.  Do not bring valuables to the hospital.  [facility] is not responsible                  for any belongings or valuables.               Contacts, dentures or bridgework may not be worn into surgery.  Leave suitcase in the car. After surgery it may be brought to your room.  For patients admitted to the hospital, discharge time is determined by your                treatment team.               Patients discharged the day of surgery will not be allowed to drive  home.  Name and phone number of your driver:   Special Instructions: [place] - Preparing for Surgery  Before surgery, you can play an important role.  Because skin is not sterile, your skin needs to be as free of germs as possible.  You can reduce the number of germs on you skin by washing with CHG (chlorahexidine gluconate) soap before surgery.  CHG is an antiseptic cleaner which kills germs and bonds with the skin to continue killing germs even after washing.  Please DO NOT use if you have an allergy to CHG or antibacterial soaps.  If your skin becomes reddened/irritated stop using the CHG and inform your nurse when you arrive at Short Stay.  Do not shave (including legs and underarms) for at least 48 hours prior to the first CHG shower.  You may shave your face.  Please follow these  instructions carefully:   1.  Shower with CHG Soap the night before surgery and the                                morning of Surgery.  2.  If you choose to wash your hair, wash your hair first as usual with your       normal shampoo.  3.  After you shampoo, rinse your hair and body thoroughly to remove the                      Shampoo.  4.  Use CHG as you would any other liquid soap.  You can apply chg directly       to the skin and wash gently with scrungie or a clean washcloth.  5.  Apply the CHG Soap to your body ONLY FROM THE NECK DOWN.        Do not use on open wounds or open sores.  Avoid contact with your eyes,       ears,  mouth and genitals (private parts).  Wash genitals (private parts)       with your normal soap.  6.  Wash thoroughly, paying special attention to the area where your surgery        will be performed.  7.  Thoroughly rinse your body with warm water from the neck down.  8.  DO NOT shower/wash with your normal soap after using and rinsing off       the CHG Soap.  9.  Pat yourself dry with a clean towel.            10.  Wear clean pajamas.            11.  Place clean sheets on your bed the night of your first shower and do not        sleep with pets.  Day of Surgery  Do not apply any lotions/deoderants the morning of surgery.  Please wear clean clothes to the hospital/surgery center.     Please read over the following fact sheets that you were given: Pain Booklet, Coughing and Deep Breathing, MRSA Information and Surgical Site Infection Prevention

## 2014-07-03 ENCOUNTER — Telehealth: Payer: Self-pay

## 2014-07-03 NOTE — Telephone Encounter (Signed)
Left message return call on voicemail. Per Dr. Brett CanalesSteve- Recent glucose and liver enzymes are elevated with pre-operative testing. Recommend A1C and office visit in a month when better from recent surgery.

## 2014-07-03 NOTE — Progress Notes (Signed)
Anesthesia Chart Review:  Patient is a 54 year old male scheduled for L3-4 microdiskectomy on 07/04/14 by Dr. Franky Machoabbell.  History includes non-smoker, nephrolithiasis, fatty liver (by 2011 U/S), GERD, ED, cholecystectomy, back surgery.  Occasional ETOH. BMI is consistent with morbid obesity. OSA screening score is 4.  PCP is Dr. Vilinda BlanksWilliam S. Luking.   Meds include Claritin-D, fish oil, Percocet.  Preoperative labs noted. Non-fasting glucose 162. AST 60 (normal range 15-41), ALT 99 (normal range 17-63). CBC WNL. Currently, there are no comparison LFTs in Epic.  AST/ALT are elevated, but less than two times above normal.  His PLT count is normal.  His fatty liver and Percocet could be contributing.  He did not report any known history of hepatitis. CMET results routed to Dr. Gerda DissLuking for future follow-up purposes.    Since AST/ALT are not > 2 times above normal and there is no evidence of thrombocytopenia, then I anticipate that he can proceed as planned.   Shonna Chockllison Sophina Mitten, PA-C [facility]/Anesthesiology Phone [phone] 07/03/2014 10:00 AM

## 2014-07-03 NOTE — Telephone Encounter (Signed)
Notified patient recent glucose and liver enzymes are elevated with pre-operative testing. Recommend A1C and office visit in a month when better from recent surgery. Patient verbalized understanding and will call back and schedule appointment.

## 2014-07-04 ENCOUNTER — Ambulatory Visit (HOSPITAL_COMMUNITY): Payer: BLUE CROSS/BLUE SHIELD | Admitting: Vascular Surgery

## 2014-07-04 ENCOUNTER — Ambulatory Visit (HOSPITAL_COMMUNITY)
Admission: RE | Admit: 2014-07-04 | Discharge: 2014-07-05 | Disposition: A | Discharge status: Home / Self Care | Payer: BLUE CROSS/BLUE SHIELD | Source: Ambulatory Visit | Attending: Neurosurgery | Admitting: Neurosurgery

## 2014-07-04 ENCOUNTER — Encounter (HOSPITAL_COMMUNITY)
Admission: RE | Disposition: A | Discharge status: Home / Self Care | Payer: Self-pay | Source: Ambulatory Visit | Attending: Neurosurgery

## 2014-07-04 ENCOUNTER — Encounter (HOSPITAL_COMMUNITY): Payer: Self-pay | Admitting: General Practice

## 2014-07-04 ENCOUNTER — Ambulatory Visit (HOSPITAL_COMMUNITY): Payer: BLUE CROSS/BLUE SHIELD

## 2014-07-04 ENCOUNTER — Ambulatory Visit (HOSPITAL_COMMUNITY): Payer: BLUE CROSS/BLUE SHIELD | Admitting: Anesthesiology

## 2014-07-04 DIAGNOSIS — M5126 Other intervertebral disc displacement, lumbar region: Secondary | ICD-10-CM | POA: Diagnosis not present

## 2014-07-04 DIAGNOSIS — N529 Male erectile dysfunction, unspecified: Secondary | ICD-10-CM | POA: Insufficient documentation

## 2014-07-04 DIAGNOSIS — Z419 Encounter for procedure for purposes other than remedying health state, unspecified: Secondary | ICD-10-CM

## 2014-07-04 DIAGNOSIS — Z6841 Body Mass Index (BMI) 40.0 and over, adult: Secondary | ICD-10-CM | POA: Insufficient documentation

## 2014-07-04 DIAGNOSIS — Z9049 Acquired absence of other specified parts of digestive tract: Secondary | ICD-10-CM | POA: Insufficient documentation

## 2014-07-04 DIAGNOSIS — K219 Gastro-esophageal reflux disease without esophagitis: Secondary | ICD-10-CM | POA: Insufficient documentation

## 2014-07-04 DIAGNOSIS — Z886 Allergy status to analgesic agent status: Secondary | ICD-10-CM | POA: Insufficient documentation

## 2014-07-04 DIAGNOSIS — Z9852 Vasectomy status: Secondary | ICD-10-CM | POA: Insufficient documentation

## 2014-07-04 HISTORY — PX: LUMBAR LAMINECTOMY/DECOMPRESSION MICRODISCECTOMY: SHX5026

## 2014-07-04 SURGERY — LUMBAR LAMINECTOMY/DECOMPRESSION MICRODISCECTOMY 1 LEVEL
Anesthesia: General | Site: Back

## 2014-07-04 MED ORDER — ROCURONIUM BROMIDE 50 MG/5ML IV SOLN
INTRAVENOUS | Status: AC
  Filled 2014-07-04: qty 2

## 2014-07-04 MED ORDER — DIAZEPAM 5 MG PO TABS
5.0000 mg | ORAL_TABLET | Freq: Four times a day (QID) | ORAL | Status: DC | PRN
  Administered 2014-07-04 – 2014-07-05 (×2): 5 mg via ORAL
  Filled 2014-07-04 (×2): qty 1

## 2014-07-04 MED ORDER — FENTANYL CITRATE (PF) 100 MCG/2ML IJ SOLN
INTRAMUSCULAR | Status: DC | PRN
  Administered 2014-07-04: 100 ug via INTRAVENOUS

## 2014-07-04 MED ORDER — KETOROLAC TROMETHAMINE 30 MG/ML IJ SOLN
30.0000 mg | Freq: Four times a day (QID) | INTRAMUSCULAR | Status: DC
  Administered 2014-07-04 – 2014-07-05 (×2): 30 mg via INTRAVENOUS
  Filled 2014-07-04 (×4): qty 1

## 2014-07-04 MED ORDER — DEXTROSE 5 % IV SOLN
3.0000 g | INTRAVENOUS | Status: DC | PRN
  Administered 2014-07-04: 3 g via INTRAVENOUS

## 2014-07-04 MED ORDER — GLYCOPYRROLATE 0.2 MG/ML IJ SOLN
INTRAMUSCULAR | Status: AC
  Filled 2014-07-04: qty 4

## 2014-07-04 MED ORDER — OXYCODONE HCL 5 MG PO TABS
ORAL_TABLET | ORAL | Status: AC
  Filled 2014-07-04: qty 1

## 2014-07-04 MED ORDER — FENTANYL CITRATE (PF) 100 MCG/2ML IJ SOLN
INTRAMUSCULAR | Status: DC | PRN
  Administered 2014-07-04: 100 ug via INTRAVENOUS
  Administered 2014-07-04: 50 ug via INTRAVENOUS
  Administered 2014-07-04: 150 ug via INTRAVENOUS
  Administered 2014-07-04 (×2): 50 ug via INTRAVENOUS

## 2014-07-04 MED ORDER — ONDANSETRON HCL 4 MG/2ML IJ SOLN
INTRAMUSCULAR | Status: AC
  Filled 2014-07-04: qty 2

## 2014-07-04 MED ORDER — LIDOCAINE-EPINEPHRINE 0.5 %-1:200000 IJ SOLN
INTRAMUSCULAR | Status: DC | PRN
  Administered 2014-07-04: 10 mL

## 2014-07-04 MED ORDER — POTASSIUM CHLORIDE IN NACL 20-0.9 MEQ/L-% IV SOLN
INTRAVENOUS | Status: DC
  Filled 2014-07-04 (×3): qty 1000

## 2014-07-04 MED ORDER — HYDROMORPHONE HCL 1 MG/ML IJ SOLN
INTRAMUSCULAR | Status: AC
  Filled 2014-07-04: qty 1

## 2014-07-04 MED ORDER — BISACODYL 5 MG PO TBEC
5.0000 mg | DELAYED_RELEASE_TABLET | Freq: Every day | ORAL | Status: DC | PRN
  Filled 2014-07-04: qty 1

## 2014-07-04 MED ORDER — PROPOFOL 10 MG/ML IV BOLUS
INTRAVENOUS | Status: AC
  Filled 2014-07-04: qty 20

## 2014-07-04 MED ORDER — HEMOSTATIC AGENTS (NO CHARGE) OPTIME
TOPICAL | Status: DC | PRN
  Administered 2014-07-04: 1 via TOPICAL

## 2014-07-04 MED ORDER — PREGABALIN 50 MG PO CAPS
75.0000 mg | ORAL_CAPSULE | Freq: Two times a day (BID) | ORAL | Status: DC
  Administered 2014-07-04 – 2014-07-05 (×2): 75 mg via ORAL
  Filled 2014-07-04 (×4): qty 1

## 2014-07-04 MED ORDER — PROMETHAZINE HCL 25 MG/ML IJ SOLN: 6.2500 mg | INTRAMUSCULAR | Status: DC | PRN

## 2014-07-04 MED ORDER — ACETAMINOPHEN 325 MG PO TABS: 650.0000 mg | ORAL_TABLET | ORAL | Status: DC | PRN

## 2014-07-04 MED ORDER — MIDAZOLAM HCL 2 MG/2ML IJ SOLN
INTRAMUSCULAR | Status: AC
  Filled 2014-07-04: qty 2

## 2014-07-04 MED ORDER — PHENOL 1.4 % MT LIQD: 1.0000 | OROMUCOSAL | Status: DC | PRN

## 2014-07-04 MED ORDER — PROPOFOL 10 MG/ML IV BOLUS
INTRAVENOUS | Status: DC | PRN
  Administered 2014-07-04 (×2): 50 mg via INTRAVENOUS
  Administered 2014-07-04: 200 mg via INTRAVENOUS

## 2014-07-04 MED ORDER — OXYCODONE HCL 5 MG PO TABS
5.0000 mg | ORAL_TABLET | Freq: Once | ORAL | Status: AC | PRN
  Administered 2014-07-04: 5 mg via ORAL

## 2014-07-04 MED ORDER — PHENYLEPHRINE 40 MCG/ML (10ML) SYRINGE FOR IV PUSH (FOR BLOOD PRESSURE SUPPORT)
PREFILLED_SYRINGE | INTRAVENOUS | Status: AC
  Filled 2014-07-04: qty 10

## 2014-07-04 MED ORDER — MUPIROCIN 2 % EX OINT: 1.0000 "application " | TOPICAL_OINTMENT | Freq: Two times a day (BID) | CUTANEOUS | Status: DC

## 2014-07-04 MED ORDER — LACTATED RINGERS IV SOLN
INTRAVENOUS | Status: DC
  Administered 2014-07-04: 13:00:00 via INTRAVENOUS

## 2014-07-04 MED ORDER — LIDOCAINE HCL (CARDIAC) 20 MG/ML IV SOLN
INTRAVENOUS | Status: AC
  Filled 2014-07-04: qty 10

## 2014-07-04 MED ORDER — MUPIROCIN 2 % EX OINT
1.0000 "application " | TOPICAL_OINTMENT | Freq: Two times a day (BID) | CUTANEOUS | Status: DC
  Administered 2014-07-04 – 2014-07-05 (×2): 1 via NASAL

## 2014-07-04 MED ORDER — THROMBIN 5000 UNITS EX SOLR
CUTANEOUS | Status: DC | PRN
  Administered 2014-07-04 (×2): 5000 [IU] via TOPICAL

## 2014-07-04 MED ORDER — HYDROMORPHONE HCL 1 MG/ML IJ SOLN: 0.5000 mg | INTRAMUSCULAR | Status: DC | PRN

## 2014-07-04 MED ORDER — OXYCODONE HCL 5 MG/5ML PO SOLN: 5.0000 mg | Freq: Once | ORAL | Status: AC | PRN

## 2014-07-04 MED ORDER — ONDANSETRON HCL 4 MG/2ML IJ SOLN
4.0000 mg | INTRAMUSCULAR | Status: DC | PRN
  Administered 2014-07-04: 4 mg via INTRAVENOUS
  Filled 2014-07-04: qty 2

## 2014-07-04 MED ORDER — FENTANYL CITRATE (PF) 250 MCG/5ML IJ SOLN
INTRAMUSCULAR | Status: AC
  Filled 2014-07-04: qty 5

## 2014-07-04 MED ORDER — KETOROLAC TROMETHAMINE 30 MG/ML IJ SOLN
INTRAMUSCULAR | Status: AC
  Filled 2014-07-04: qty 1

## 2014-07-04 MED ORDER — MENTHOL 3 MG MT LOZG: 1.0000 | LOZENGE | OROMUCOSAL | Status: DC | PRN

## 2014-07-04 MED ORDER — ACETAMINOPHEN 650 MG RE SUPP: 650.0000 mg | RECTAL | Status: DC | PRN

## 2014-07-04 MED ORDER — 0.9 % SODIUM CHLORIDE (POUR BTL) OPTIME
TOPICAL | Status: DC | PRN
  Administered 2014-07-04: 1000 mL

## 2014-07-04 MED ORDER — MIDAZOLAM HCL 5 MG/5ML IJ SOLN
INTRAMUSCULAR | Status: DC | PRN
  Administered 2014-07-04: 2 mg via INTRAVENOUS

## 2014-07-04 MED ORDER — ONDANSETRON HCL 4 MG/2ML IJ SOLN
INTRAMUSCULAR | Status: DC | PRN
  Administered 2014-07-04: 4 mg via INTRAVENOUS

## 2014-07-04 MED ORDER — OXYCODONE-ACETAMINOPHEN 5-325 MG PO TABS
1.0000 | ORAL_TABLET | ORAL | Status: DC | PRN
  Administered 2014-07-05: 2 via ORAL
  Filled 2014-07-04: qty 2

## 2014-07-04 MED ORDER — GLYCOPYRROLATE 0.2 MG/ML IJ SOLN
INTRAMUSCULAR | Status: DC | PRN
  Administered 2014-07-04: .8 mg via INTRAVENOUS

## 2014-07-04 MED ORDER — FENTANYL CITRATE (PF) 100 MCG/2ML IJ SOLN
INTRAMUSCULAR | Status: AC
  Filled 2014-07-04: qty 2

## 2014-07-04 MED ORDER — STERILE WATER FOR INJECTION IJ SOLN
INTRAMUSCULAR | Status: AC
  Filled 2014-07-04: qty 10

## 2014-07-04 MED ORDER — SODIUM CHLORIDE 0.9 % IJ SOLN: 3.0000 mL | Freq: Two times a day (BID) | INTRAMUSCULAR | Status: DC

## 2014-07-04 MED ORDER — KETOROLAC TROMETHAMINE 30 MG/ML IJ SOLN
30.0000 mg | Freq: Once | INTRAMUSCULAR | Status: AC | PRN
  Administered 2014-07-04: 30 mg via INTRAVENOUS

## 2014-07-04 MED ORDER — NEOSTIGMINE METHYLSULFATE 10 MG/10ML IV SOLN
INTRAVENOUS | Status: DC | PRN
  Administered 2014-07-04: 4 mg via INTRAVENOUS

## 2014-07-04 MED ORDER — LIDOCAINE HCL (CARDIAC) 20 MG/ML IV SOLN
INTRAVENOUS | Status: DC | PRN
  Administered 2014-07-04: 50 mg via INTRAVENOUS

## 2014-07-04 MED ORDER — METHYLPREDNISOLONE ACETATE 80 MG/ML IJ SUSP
INTRAMUSCULAR | Status: DC | PRN
  Administered 2014-07-04: 80 mg

## 2014-07-04 MED ORDER — LACTATED RINGERS IV SOLN
INTRAVENOUS | Status: DC | PRN
  Administered 2014-07-04 (×3): via INTRAVENOUS

## 2014-07-04 MED ORDER — SUCCINYLCHOLINE CHLORIDE 20 MG/ML IJ SOLN
INTRAMUSCULAR | Status: DC | PRN
  Administered 2014-07-04: 120 mg via INTRAVENOUS

## 2014-07-04 MED ORDER — HYDROMORPHONE HCL 1 MG/ML IJ SOLN
0.2500 mg | INTRAMUSCULAR | Status: DC | PRN
  Administered 2014-07-04 (×2): 0.5 mg via INTRAVENOUS

## 2014-07-04 MED ORDER — SUCCINYLCHOLINE CHLORIDE 20 MG/ML IJ SOLN
INTRAMUSCULAR | Status: AC
  Filled 2014-07-04: qty 1

## 2014-07-04 MED ORDER — MAGNESIUM CITRATE PO SOLN
1.0000 | Freq: Once | ORAL | Status: AC | PRN
  Filled 2014-07-04: qty 296

## 2014-07-04 MED ORDER — ROCURONIUM BROMIDE 100 MG/10ML IV SOLN
INTRAVENOUS | Status: DC | PRN
  Administered 2014-07-04: 30 mg via INTRAVENOUS
  Administered 2014-07-04 (×2): 10 mg via INTRAVENOUS

## 2014-07-04 MED ORDER — SODIUM CHLORIDE 0.9 % IJ SOLN: 3.0000 mL | INTRAMUSCULAR | Status: DC | PRN

## 2014-07-04 SURGICAL SUPPLY — 61 items
APL SKNCLS STERI-STRIP NONHPOA (GAUZE/BANDAGES/DRESSINGS)
BAG DECANTER FOR FLEXI CONT (MISCELLANEOUS) ×3 IMPLANT
BENZOIN TINCTURE PRP APPL 2/3 (GAUZE/BANDAGES/DRESSINGS) IMPLANT
BLADE CLIPPER SURG (BLADE) ×2 IMPLANT
BLADE SURG 11 STRL SS (BLADE) ×2 IMPLANT
BUR MATCHSTICK NEURO 3.0 LAGG (BURR) ×3 IMPLANT
CANISTER SUCT 3000ML PPV (MISCELLANEOUS) ×3 IMPLANT
CLOSURE WOUND 1/2 X4 (GAUZE/BANDAGES/DRESSINGS)
CONT SPEC 4OZ CLIKSEAL STRL BL (MISCELLANEOUS) ×3 IMPLANT
DECANTER SPIKE VIAL GLASS SM (MISCELLANEOUS) ×3 IMPLANT
DRAPE LAPAROTOMY 100X72X124 (DRAPES) ×3 IMPLANT
DRAPE MICROSCOPE LEICA (MISCELLANEOUS) ×3 IMPLANT
DRAPE POUCH INSTRU U-SHP 10X18 (DRAPES) ×3 IMPLANT
DRAPE SURG 17X23 STRL (DRAPES) ×3 IMPLANT
DURAPREP 26ML APPLICATOR (WOUND CARE) ×3 IMPLANT
ELECT BLADE 4.0 EZ CLEAN MEGAD (MISCELLANEOUS) ×3
ELECT REM PT RETURN 9FT ADLT (ELECTROSURGICAL) ×3
ELECTRODE BLDE 4.0 EZ CLN MEGD (MISCELLANEOUS) IMPLANT
ELECTRODE REM PT RTRN 9FT ADLT (ELECTROSURGICAL) ×1 IMPLANT
GAUZE SPONGE 4X4 12PLY STRL (GAUZE/BANDAGES/DRESSINGS) IMPLANT
GAUZE SPONGE 4X4 16PLY XRAY LF (GAUZE/BANDAGES/DRESSINGS) IMPLANT
GLOVE BIO SURGEON STRL SZ 6.5 (GLOVE) ×2 IMPLANT
GLOVE BIO SURGEONS STRL SZ 6.5 (GLOVE) ×2
GLOVE BIOGEL PI IND STRL 6.5 (GLOVE) IMPLANT
GLOVE BIOGEL PI INDICATOR 6.5 (GLOVE) ×2
GLOVE ECLIPSE 6.5 STRL STRAW (GLOVE) ×3 IMPLANT
GLOVE ECLIPSE 9.0 STRL (GLOVE) ×2 IMPLANT
GLOVE EXAM NITRILE LRG STRL (GLOVE) IMPLANT
GLOVE EXAM NITRILE MD LF STRL (GLOVE) IMPLANT
GLOVE EXAM NITRILE XL STR (GLOVE) IMPLANT
GLOVE EXAM NITRILE XS STR PU (GLOVE) IMPLANT
GOWN STRL REUS W/ TWL LRG LVL3 (GOWN DISPOSABLE) ×2 IMPLANT
GOWN STRL REUS W/ TWL XL LVL3 (GOWN DISPOSABLE) IMPLANT
GOWN STRL REUS W/TWL 2XL LVL3 (GOWN DISPOSABLE) IMPLANT
GOWN STRL REUS W/TWL LRG LVL3 (GOWN DISPOSABLE) ×6
GOWN STRL REUS W/TWL XL LVL3 (GOWN DISPOSABLE) ×3
KIT BASIN OR (CUSTOM PROCEDURE TRAY) ×3 IMPLANT
KIT ROOM TURNOVER OR (KITS) ×3 IMPLANT
LIQUID BAND (GAUZE/BANDAGES/DRESSINGS) ×3 IMPLANT
NDL HYPO 18GX1.5 BLUNT FILL (NEEDLE) IMPLANT
NDL HYPO 25X1 1.5 SAFETY (NEEDLE) ×1 IMPLANT
NDL SPNL 18GX3.5 QUINCKE PK (NEEDLE) IMPLANT
NEEDLE HYPO 18GX1.5 BLUNT FILL (NEEDLE) ×3 IMPLANT
NEEDLE HYPO 25X1 1.5 SAFETY (NEEDLE) ×3 IMPLANT
NEEDLE SPNL 18GX3.5 QUINCKE PK (NEEDLE) ×3 IMPLANT
NS IRRIG 1000ML POUR BTL (IV SOLUTION) ×3 IMPLANT
PACK LAMINECTOMY NEURO (CUSTOM PROCEDURE TRAY) ×3 IMPLANT
PAD ARMBOARD 7.5X6 YLW CONV (MISCELLANEOUS) ×9 IMPLANT
RUBBERBAND STERILE (MISCELLANEOUS) ×6 IMPLANT
SPONGE LAP 4X18 X RAY DECT (DISPOSABLE) IMPLANT
SPONGE SURGIFOAM ABS GEL SZ50 (HEMOSTASIS) ×3 IMPLANT
STRIP CLOSURE SKIN 1/2X4 (GAUZE/BANDAGES/DRESSINGS) IMPLANT
SUT VIC AB 0 CT1 18XCR BRD8 (SUTURE) ×1 IMPLANT
SUT VIC AB 0 CT1 8-18 (SUTURE) ×3
SUT VIC AB 2-0 CT1 18 (SUTURE) ×3 IMPLANT
SUT VIC AB 3-0 SH 8-18 (SUTURE) ×3 IMPLANT
SYR 20ML ECCENTRIC (SYRINGE) ×3 IMPLANT
SYR 3ML LL SCALE MARK (SYRINGE) ×2 IMPLANT
TOWEL OR 17X24 6PK STRL BLUE (TOWEL DISPOSABLE) ×3 IMPLANT
TOWEL OR 17X26 10 PK STRL BLUE (TOWEL DISPOSABLE) ×3 IMPLANT
WATER STERILE IRR 1000ML POUR (IV SOLUTION) ×3 IMPLANT

## 2014-07-04 NOTE — Anesthesia Procedure Notes (Signed)
Procedure Name: Intubation Date/Time: 07/04/2014 4:11 PM Performed by: Eligha Bridegroom Pre-anesthesia Checklist: Emergency Drugs available, Patient identified, Timeout performed, Suction available and Patient being monitored Patient Re-evaluated:Patient Re-evaluated prior to inductionOxygen Delivery Method: Circle system utilized Preoxygenation: Pre-oxygenation with 100% oxygen Intubation Type: IV induction Ventilation: Mask ventilation without difficulty Laryngoscope Size: Mac and 4 Grade View: Grade II Tube type: Oral Tube size: 8.0 mm Number of attempts: 1 Airway Equipment and Method: Stylet and LTA kit utilized Placement Confirmation: ETT inserted through vocal cords under direct vision,  breath sounds checked- equal and bilateral and positive ETCO2 Secured at: 23 cm Tube secured with: Tape Dental Injury: Teeth and Oropharynx as per pre-operative assessment

## 2014-07-04 NOTE — Anesthesia Preprocedure Evaluation (Addendum)
Anesthesia Evaluation  Patient identified by MRN, date of birth, ID band Patient awake    Reviewed: Allergy & Precautions, NPO status , Patient's Chart, lab work & pertinent test results  Airway Mallampati: II  TM Distance: >3 FB Neck ROM: Full    Dental   Pulmonary neg pulmonary ROS,  breath sounds clear to auscultation        Cardiovascular negative cardio ROS  Rhythm:Regular Rate:Normal     Neuro/Psych negative neurological ROS     GI/Hepatic GERD-  ,Fatty liver Elevated LFT's   Endo/Other  Morbid obesity  Renal/GU negative Renal ROS     Musculoskeletal negative musculoskeletal ROS (+)   Abdominal   Peds  Hematology negative hematology ROS (+)   Anesthesia Other Findings   Reproductive/Obstetrics                            Lab Results  Component Value Date   WBC 7.4 07/02/2014   HGB 14.8 07/02/2014   HCT 43.0 07/02/2014   MCV 89.4 07/02/2014   PLT 193 07/02/2014   Lab Results  Component Value Date   CREATININE 0.96 07/02/2014   BUN 8 07/02/2014   NA 140 07/02/2014   K 4.0 07/02/2014   CL 106 07/02/2014   CO2 26 07/02/2014    Anesthesia Physical Anesthesia Plan  ASA: II  Anesthesia Plan: General   Post-op Pain Management:    Induction: Intravenous  Airway Management Planned: Oral ETT  Additional Equipment:   Intra-op Plan:   Post-operative Plan: Extubation in OR  Informed Consent: I have reviewed the patients History and Physical, chart, labs and discussed the procedure including the risks, benefits and alternatives for the proposed anesthesia with the patient or authorized representative who has indicated his/her understanding and acceptance.   Dental advisory given  Plan Discussed with: CRNA  Anesthesia Plan Comments:        Anesthesia Quick Evaluation

## 2014-07-04 NOTE — Transfer of Care (Signed)
Immediate Anesthesia Transfer of Care Note  Patient: Daniel Conner  Procedure(s) Performed: Procedure(s) with comments:  Left lumbar three- lumbar four microdiscectomy (N/A) - L34 microdiskectomy  Patient Location: PACU  Anesthesia Type:General  Level of Consciousness: awake, alert  and oriented  Airway & Oxygen Therapy: Patient Spontanous Breathing and Patient connected to nasal cannula oxygen  Post-op Assessment: Report given to RN and Post -op Vital signs reviewed and stable  Post vital signs: Reviewed and stable  Last Vitals:  Filed Vitals:   07/04/14 1218  BP: 165/86  Pulse: 74  Temp: 36.7 C  Resp: 18    Complications: No apparent anesthesia complications

## 2014-07-04 NOTE — Op Note (Signed)
07/04/2014  6:02 PM  PATIENT:  Daniel Conner  54 y.o. male with left lower extremity pain  PRE-OPERATIVE DIAGNOSIS:  lumbar herniated disc far lateral L3/4 left  POST-OPERATIVE DIAGNOSIS:  lumbar herniated disc far lateral L3/4 left  PROCEDURE:  Procedure(s):  Left lumbar three- lumbar four microdiscectomy  SURGEON:   Surgeon(s): Coletta MemosKyle Sherill Mangen, MD Julio SicksHenry Pool, MD  ASSISTANTS:pool, Sherilyn CooterHenry  ANESTHESIA:   general  EBL:  Total I/O In: 2000 [I.V.:2000] Out: -   BLOOD ADMINISTERED:none  CELL SAVER GIVEN:none  COUNT:per nursing  DRAINS: none   SPECIMEN:  No Specimen  DICTATION: Daniel Conner was taken to the operating room, intubated and placed under a general anesthetic without difficulty. Daniel Conner was positioned prone on a Wilson frame with all pressure points padded. His back was prepped and draped in a sterile manner. I opened the skin with a 10 blade and carried the dissection down to the thoracolumbar fascia. I used both sharp dissection and the monopolar cautery to expose the lamina of L2, and L3. I confirmed my location with an intraoperative xray.  I used the drill, Kerrison punches, and curettes to perform a semihemilaminectomy of L3 at the pars laterally. I used the punches to remove the ligamentum flavum to expose the L3 nerve root. I brought the microscope into the operative field and with Dr.Pool's assistance we started our decompression of the and L3 root(s). I cauterized epidural veins overlying the disc space then divided them sharply. I opened the disc space with a 11 blade and proceeded with the discectomy. I used pituitary rongeurs, curettes, and other instruments to remove disc material in the extraforaminal. After the discectomy was completed we inspected the L3 nerve root and felt it was well decompressed. I explored rostrally, laterally, medially, and caudally and was satisfied with the decompression. I irrigated the wound, then closed in layers. I approximated the  thoracolumbar fascia, subcutaneous, and subcuticular planes with vicryl sutures. I used dermabond for a sterile dressing.   PLAN OF CARE: Admit for overnight observation  PATIENT DISPOSITION:  PACU - hemodynamically stable.   Delay start of Pharmacological VTE agent (>24hrs) due to surgical blood loss or risk of bleeding:  yes

## 2014-07-04 NOTE — Anesthesia Postprocedure Evaluation (Signed)
  Anesthesia Post-op Note  Patient: Daniel Conner  Procedure(s) Performed: Procedure(s) with comments:  Left lumbar three- lumbar four microdiscectomy (N/A) - L34 microdiskectomy  Patient Location: PACU  Anesthesia Type:General  Level of Consciousness: awake  Airway and Oxygen Therapy: Patient Spontanous Breathing  Post-op Pain: mild  Post-op Assessment: Post-op Vital signs reviewed  Post-op Vital Signs: Reviewed  Last Vitals:  Filed Vitals:   07/04/14 1820  BP: 153/84  Pulse: 86  Temp:   Resp: 8    Complications: No apparent anesthesia complications

## 2014-07-04 NOTE — H&P (Signed)
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  PAST MEDICAL HISTORY: Is otherwise good. FAMILY HISTORY: Mother is 6884 and in good health. Father is in 7084 and in poor health. Mother has a heart murmur. Father had colon cancer, COPD, and has Alzheimer's. CURRENT MEDICAL PROBLEMS: He has numbness and tingling in the right lower extremity. PAST SURGICAL HISTORY: He has undergone surgery on his shoulder, cholecystectomy, and a disc operation.  SOCIAL HISTORY: He does not smoke. He does drink alcohol socially. He does not use illicit drugs. PHYSICAL EXAMINATION: Vital signs are as follows, he is 5 feet 11 inches in height, weighs 298 pounds, blood pressure is 149/84, pulse is 77, respiratory rate is 14, temperature 99.1 degrees Fahrenheit. Pain is 7/10. On examination he is alert, oriented x4, and answering all questions appropriately. Memory, language, attention span, and fund of knowledge is normal. Speech is clear and fluent. Tongue and uvula in the midline. Shoulder shrug is normal. Hearing is intact to voice. 5/5 strength in both upper and lower extremity. He has intact proprioception in lower extremities. No pain elicited at the knees or ankles. He has a normal gait, though it is slightly antalgic today. He cannot sit without having to prop his leg straight out. All in all he is fairly uncomfortable.  IMAGING STUDIES: MRI shows a far lateral disc at L3-4. He has not recurrent disc herniations. IMPRESSION/PLAN: I have recommended to Daniel Conner to undergo lumbar discectomy. He has fairly profound weakness in his hip flexors on the left side. He has great difficulty taking a 14 inch step. He has none on the right side, but he does have it on the left, and his weakness troubles me to the point where I do think he needs  to have the disc removed. Conus is normal, equina is otherwise normal. We are going to get this scheduled for next week Friday. REVIEW OF SYSTEMS: Daniel Conner complains of tinnitus, kidney stones, leg pain. He denies allergic, hematologic, endocrine, psychiatric, neurological, skin, gastrointestinal, respiratory, cardiovascular, and constitutional problems. On his pain chart he lists pain in his left lower extremity only. IMPRESSION/PLAN: I have recommended to Daniel Conner to undergo lumbar discectomy. He has fairly profound weakness in his hip flexors on the left side. He has great difficulty taking a 14 inch step. He has none on the right side, but he does have it on the left, and his weakness troubles me to the point where I do think he needs to have the disc removed. Conus is normal, caudal equina is otherwise normal. We are going to get this scheduled for next week Friday.

## 2014-07-05 DIAGNOSIS — M5126 Other intervertebral disc displacement, lumbar region: Secondary | ICD-10-CM | POA: Diagnosis not present

## 2014-07-05 MED ORDER — TAMSULOSIN HCL 0.4 MG PO CAPS
0.4000 mg | ORAL_CAPSULE | Freq: Every day | ORAL | Status: DC
  Administered 2014-07-05: 0.4 mg via ORAL
  Filled 2014-07-05: qty 1

## 2014-07-05 MED ORDER — CYCLOBENZAPRINE HCL 10 MG PO TABS: 10.0000 mg | ORAL_TABLET | Freq: Three times a day (TID) | ORAL | Status: DC | PRN

## 2014-07-05 MED ORDER — GABAPENTIN 300 MG PO CAPS: 300.0000 mg | ORAL_CAPSULE | Freq: Three times a day (TID) | ORAL | Status: DC

## 2014-07-05 NOTE — Progress Notes (Signed)
Patient alert and oriented, mae's well, voiding adequate amount of urine, swallowing without difficulty, no c/o pain. Patient discharged home with family. Script and discharged instructions given to patient. Patient and family stated understanding of d/c instructions given and has an appointment with MD. 

## 2014-07-05 NOTE — Discharge Summary (Signed)
  Physician Discharge Summary  Patient ID: Daniel Conner MRN: [id number] DOB/AGE: 1960/12/16 54 y.o.  Admit date: 07/04/2014 Discharge date: 07/05/2014  Admission Diagnoses:Far lateral disc herniation Left L3/4  Discharge Diagnoses: same Active Problems:   HNP (herniated nucleus pulposus), lumbar   Discharged Condition: good  Hospital Course: Mr. Daniel Conner underwent an uncomplicated lumbar discetomy for a far lateral Left L3/4 disc herniation and foraminotomy. Post op he is voiding, ambulating, and tolerating a regular diet. His wound is clean, dry, and without signs of infection.   Treatments: surgery: as above  Discharge Exam: Blood pressure 120/78, pulse 89, temperature 98.6 F (37 C), temperature source Oral, resp. rate 18, height 5' 10.5" (1.791 m), weight 134.219 kg (295 lb 14.4 oz), SpO2 96 %. General appearance: alert, cooperative, appears stated age and mild distress Neurologic: Mental status: Alert, oriented, thought content appropriate Cranial nerves: normal Motor: remains with weakness in the quadriceps on the left  Disposition: 01-Home or Self Care lumbar herniated disc    Medication List    STOP taking these medications        predniSONE 20 MG tablet  Commonly known as:  DELTASONE      TAKE these medications        cyclobenzaprine 10 MG tablet  Commonly known as:  FLEXERIL  Take 1 tablet (10 mg total) by mouth 3 (three) times daily as needed for muscle spasms.     FISH OIL PO  Take 1 capsule by mouth daily.     gabapentin 300 MG capsule  Commonly known as:  NEURONTIN  Take 1 capsule (300 mg total) by mouth 3 (three) times daily.     loratadine-pseudoephedrine 10-240 MG per 24 hr tablet  Commonly known as:  CLARITIN-D 24-hour  Take 1 tablet by mouth daily as needed for allergies.     multivitamin with minerals Tabs tablet  Take 1 tablet by mouth daily.     oxyCODONE-acetaminophen 5-325 MG per tablet  Commonly known as:  PERCOCET/ROXICET  Take 1  tablet by mouth every 4 (four) hours as needed for severe pain.           Follow-up Information    Follow up with Caralina Nop L, MD In 3 weeks.   Specialty:  Neurosurgery   Why:  call office to make an appointment   Contact information:   [address] [place] [address] [phone]       Signed: [name],[name] L 07/05/2014, 8:15 AM

## 2014-07-05 NOTE — Discharge Instructions (Signed)
Lumbar Discectomy °Care After °A discectomy involves removal of discmaterial (the cartilage-like structures located between the bones of the back). It is done to relieve pressure on nerve roots. It can be used as a treatment for a back problem. The time in surgery depends on the findings in surgery and what is necessary to correct the problems. °HOME CARE INSTRUCTIONS  °· Check the cut (incision) made by the surgeon twice a day for signs of infection. Some signs of infection may include:  °· A foul smelling, greenish or yellowish discharge from the wound.  °· Increased pain.  °· Increased redness over the incision (operative) site.  °· The skin edges may separate.  °· Flu-like symptoms (problems).  °· A temperature above 101.5° F (38.6° C).  °· Change your bandages in about 24 to 36 hours following surgery or as directed.  °· You may shower tomrrow.  Avoid bathtubs, swimming pools and hot tubs for three weeks or until your incision has healed completely. °· Follow your doctor's instructions as to safe activities, exercises, and physical therapy.  °· Weight reduction may be beneficial if you are overweight.  °· Daily exercise is helpful to prevent the return of problems. Walking is permitted. You may use a treadmill without an incline. Cut down on activities and exercise if you have discomfort. You may also go up and down stairs as much as you can tolerate.  °· DO NOT lift anything heavier than 10 to 15 lbs. Avoid bending or twisting at the waist. Always bend your knees when lifting.  °· Maintain strength and range of motion as instructed.  °· Do not drive for 10 days, or as directed by your doctors. You may be a passenger . Lying back in the passenger seat may be more comfortable for you. Always wear a seatbelt.  °· Limit your sitting in a regular chair to 20 to 30 minutes at a time. There are no limitations for sitting in a recliner. You should lie down or walk in between sitting periods.  °· Only take  over-the-counter or prescription medicines for pain, discomfort, or fever as directed by your caregiver.  °SEEK MEDICAL CARE IF:  °· There is increased bleeding (more than a small spot) from the wound.  °· You notice redness, swelling, or increasing pain in the wound.  °· Pus is coming from wound.  °· You develop an unexplained oral temperature above 102° F (38.9° C) develops.  °· You notice a foul smell coming from the wound or dressing.  °· You have increasing pain in your wound.  °SEEK IMMEDIATE MEDICAL CARE IF:  °· You develop a rash.  °· You have difficulty breathing.  °· You develop any allergic problems to medicines given.  °Document Released: 01/06/2004 Document Revised: 01/20/2011 Document Reviewed: 04/26/2007 °ExitCare® Patient Information ° ° ° °Wound Care °Leave incision open to air. °You may shower. °Do not scrub directly on incision.  °Do not put any creams, lotions, or ointments on incision. °Activity °Walk each and every day, increasing distance each day. °No lifting greater than 5 lbs.  Avoid bending, arching, and twisting. °No driving for 2 weeks; may ride as a passenger locally. °If provided with back brace, wear when out of bed.  It is not necessary to wear in bed. °Diet °Resume your normal diet.  °Return to Work °Will be discussed at you follow up appointment. °Call Your Doctor If Any of These Occur °Redness, drainage, or swelling at the wound.  °Temperature greater than   101 degrees. °Severe pain not relieved by pain medication. °Incision starts to come apart. °Follow Up Appt °Call today for appointment in 4 weeks ([phone]) or for problems.  If you have any hardware placed in your spine, you will need an x-ray before your appointment. °

## 2014-07-08 ENCOUNTER — Encounter (HOSPITAL_COMMUNITY): Payer: Self-pay | Admitting: Neurosurgery

## 2014-07-17 ENCOUNTER — Other Ambulatory Visit: Payer: Self-pay | Admitting: Neurosurgery

## 2014-07-17 DIAGNOSIS — M5126 Other intervertebral disc displacement, lumbar region: Secondary | ICD-10-CM

## 2014-08-02 ENCOUNTER — Ambulatory Visit
Admission: RE | Admit: 2014-08-02 | Discharge: 2014-08-02 | Disposition: A | Discharge status: Home / Self Care | Payer: BLUE CROSS/BLUE SHIELD | Source: Ambulatory Visit | Attending: Neurosurgery | Admitting: Neurosurgery

## 2014-08-02 DIAGNOSIS — M5126 Other intervertebral disc displacement, lumbar region: Secondary | ICD-10-CM

## 2014-08-02 MED ORDER — GADOBENATE DIMEGLUMINE 529 MG/ML IV SOLN
20.0000 mL | Freq: Once | INTRAVENOUS | Status: AC | PRN
  Administered 2014-08-02: 20 mL via INTRAVENOUS

## 2014-10-31 ENCOUNTER — Other Ambulatory Visit: Payer: Self-pay | Admitting: Neurosurgery

## 2014-10-31 ENCOUNTER — Other Ambulatory Visit (HOSPITAL_COMMUNITY): Payer: Self-pay | Admitting: Neurosurgery

## 2014-10-31 ENCOUNTER — Other Ambulatory Visit: Payer: Self-pay | Admitting: Pediatrics

## 2014-10-31 DIAGNOSIS — M5126 Other intervertebral disc displacement, lumbar region: Secondary | ICD-10-CM

## 2014-11-18 ENCOUNTER — Ambulatory Visit (HOSPITAL_COMMUNITY)
Admission: RE | Admit: 2014-11-18 | Discharge: 2014-11-18 | Disposition: A | Discharge status: Home / Self Care | Payer: BLUE CROSS/BLUE SHIELD | Source: Ambulatory Visit | Attending: Neurosurgery | Admitting: Neurosurgery

## 2014-11-18 DIAGNOSIS — M5416 Radiculopathy, lumbar region: Secondary | ICD-10-CM | POA: Diagnosis not present

## 2014-11-18 DIAGNOSIS — M47896 Other spondylosis, lumbar region: Secondary | ICD-10-CM | POA: Insufficient documentation

## 2014-11-18 DIAGNOSIS — M5126 Other intervertebral disc displacement, lumbar region: Secondary | ICD-10-CM

## 2014-11-18 DIAGNOSIS — M4806 Spinal stenosis, lumbar region: Secondary | ICD-10-CM | POA: Insufficient documentation

## 2014-11-18 DIAGNOSIS — M4807 Spinal stenosis, lumbosacral region: Secondary | ICD-10-CM | POA: Insufficient documentation

## 2014-11-18 DIAGNOSIS — M79605 Pain in left leg: Secondary | ICD-10-CM | POA: Diagnosis present

## 2014-11-18 MED ORDER — OXYCODONE HCL 5 MG PO TABS
5.0000 mg | ORAL_TABLET | ORAL | Status: DC | PRN
  Administered 2014-11-18: 10 mg via ORAL

## 2014-11-18 MED ORDER — OXYCODONE HCL 5 MG PO TABS
ORAL_TABLET | ORAL | Status: AC
  Filled 2014-11-18: qty 2

## 2014-11-18 MED ORDER — IOHEXOL 180 MG/ML  SOLN
20.0000 mL | Freq: Once | INTRAMUSCULAR | Status: DC | PRN
  Administered 2014-11-18: 20 mL via INTRATHECAL
  Filled 2014-11-18: qty 20

## 2014-11-18 MED ORDER — ONDANSETRON HCL 4 MG/2ML IJ SOLN: 4.0000 mg | Freq: Four times a day (QID) | INTRAMUSCULAR | Status: DC | PRN

## 2014-11-18 MED ORDER — DIAZEPAM 5 MG PO TABS
10.0000 mg | ORAL_TABLET | Freq: Once | ORAL | Status: AC
  Administered 2014-11-18: 10 mg via ORAL

## 2014-11-18 MED ORDER — DIAZEPAM 5 MG PO TABS
ORAL_TABLET | ORAL | Status: AC
  Filled 2014-11-18: qty 2

## 2014-11-18 MED ORDER — LIDOCAINE HCL (PF) 1 % IJ SOLN
INTRAMUSCULAR | Status: AC
  Filled 2014-11-18: qty 10

## 2014-11-18 NOTE — Op Note (Signed)
11/18/2014 Lumbar Myelogram  PATIENT:  Daniel Conner is a 54 y.o. male  PRE-OPERATIVE DIAGNOSIS:  Osteoarthritis with lower extremity pain  POST-OPERATIVE DIAGNOSIS:  Osteoarthritis with radiculopathy  PROCEDURE:  Lumbar Myelogram  SURGEON:  Simrin Vegh  ANESTHESIA:   local and IV sedation LOCAL MEDICATIONS USED:  LIDOCAINE  and Amount: 8 ml Procedure Note: [name] is a 54 y.o. male Was taken to the fluoroscopy suite and  positioned prone on the fluoroscopy table. His back was prepared and draped in a sterile manner. I infiltrated 8 cc into the lumbar region. I then introduced a spinal needle into the thecal sac at the L3/4 interlaminar space. I infiltrated 8cc of Omnipaque 180 into the thecal sac. Fluoroscopy showed the needle and contrast in the thecal sac. Leroy Sea tolerated the procedure well. he Will be taken to CT for evaluation.     PATIENT DISPOSITION:  PACU - hemodynamically stable.

## 2014-11-18 NOTE — Discharge Instructions (Signed)
Myelogram and Lumbar Puncture Discharge Instructions ° °1. Go home and rest quietly for the next 24 hours.  It is important to lie flat for the next 24 hours.  Get up only to go to the restroom.  You may lie in the bed or on a couch on your back, your stomach, your left side or your right side.  You may have one pillow under your head.  You may have pillows between your knees while you are on your side or under your knees while you are on your back. ° °2. DO NOT drive today.  Recline the seat as far back as it will go, while still wearing your seat belt, on the way home. ° °3. You may get up to go to the bathroom as needed.  You may sit up for 10 minutes to eat.  You may resume your normal diet and medications unless otherwise indicated. ° °4. The incidence of headache, nausea, or vomiting is about 5% (one in 20 patients).  If you develop a headache, lie flat and drink plenty of fluids until the headache goes away.  Caffeinated beverages may be helpful.  If you develop severe nausea and vomiting or a headache that does not go away with flat bed rest, call Dr Cabbell. ° °5. You may resume normal activities after your 24 hours of bed rest is over; however, do not exert yourself strongly or do any heavy lifting tomorrow. ° °6. Call your physician for a follow-up appointment.  The results of your myelogram will be sent directly to your physician by the following day. ° °7. If you have any questions or if complications develop after you arrive home, please call Dr Cabbell. ° °Discharge instructions have been explained to the patient.  The patient, or the person responsible for the patient, fully understands these instructions. ° ° °

## 2014-12-30 DIAGNOSIS — Z029 Encounter for administrative examinations, unspecified: Secondary | ICD-10-CM

## 2014-12-31 ENCOUNTER — Other Ambulatory Visit: Payer: Self-pay | Admitting: Neurosurgery

## 2015-01-29 ENCOUNTER — Other Ambulatory Visit: Payer: Self-pay

## 2015-01-29 ENCOUNTER — Encounter (HOSPITAL_COMMUNITY): Payer: Self-pay

## 2015-01-29 ENCOUNTER — Encounter (HOSPITAL_COMMUNITY)
Admission: RE | Admit: 2015-01-29 | Discharge: 2015-01-29 | Disposition: A | Discharge status: Home / Self Care | Payer: BLUE CROSS/BLUE SHIELD | Source: Ambulatory Visit | Attending: Neurosurgery | Admitting: Neurosurgery

## 2015-01-29 DIAGNOSIS — Z01812 Encounter for preprocedural laboratory examination: Secondary | ICD-10-CM | POA: Insufficient documentation

## 2015-01-29 DIAGNOSIS — Z01818 Encounter for other preprocedural examination: Secondary | ICD-10-CM | POA: Insufficient documentation

## 2015-01-29 DIAGNOSIS — Z0183 Encounter for blood typing: Secondary | ICD-10-CM | POA: Insufficient documentation

## 2015-01-29 HISTORY — DX: Personal history of urinary calculi: Z87.442

## 2015-01-29 HISTORY — DX: Carpal tunnel syndrome, unspecified upper limb: G56.00

## 2015-01-29 HISTORY — DX: Unspecified osteoarthritis, unspecified site: M19.90

## 2015-01-29 HISTORY — DX: Reserved for inherently not codable concepts without codable children: IMO0001

## 2015-01-29 LAB — CBC
HCT: 42.5 % (ref 39.0–52.0)
Hemoglobin: 14.3 g/dL (ref 13.0–17.0)
MCH: 30.7 pg (ref 26.0–34.0)
MCHC: 33.6 g/dL (ref 30.0–36.0)
MCV: 91.2 fL (ref 78.0–100.0)
PLATELETS: 216 10*3/uL (ref 150–400)
RBC: 4.66 MIL/uL (ref 4.22–5.81)
RDW: 13 % (ref 11.5–15.5)
WBC: 9.1 10*3/uL (ref 4.0–10.5)

## 2015-01-29 LAB — TYPE AND SCREEN
ABO/RH(D): AB NEG
Antibody Screen: NEGATIVE

## 2015-01-29 LAB — ABO/RH: ABO/RH(D): AB NEG

## 2015-01-29 LAB — COMPREHENSIVE METABOLIC PANEL
ALT: 149 U/L — ABNORMAL HIGH (ref 17–63)
ANION GAP: 9 (ref 5–15)
AST: 113 U/L — AB (ref 15–41)
Albumin: 4 g/dL (ref 3.5–5.0)
Alkaline Phosphatase: 63 U/L (ref 38–126)
BILIRUBIN TOTAL: 0.6 mg/dL (ref 0.3–1.2)
BUN: 10 mg/dL (ref 6–20)
CHLORIDE: 106 mmol/L (ref 101–111)
CO2: 25 mmol/L (ref 22–32)
Calcium: 9.6 mg/dL (ref 8.9–10.3)
Creatinine, Ser: 1 mg/dL (ref 0.61–1.24)
Glucose, Bld: 117 mg/dL — ABNORMAL HIGH (ref 65–99)
POTASSIUM: 4 mmol/L (ref 3.5–5.1)
Sodium: 140 mmol/L (ref 135–145)
TOTAL PROTEIN: 7.5 g/dL (ref 6.5–8.1)

## 2015-01-29 LAB — SURGICAL PCR SCREEN
MRSA, PCR: NEGATIVE
STAPHYLOCOCCUS AUREUS: POSITIVE — AB

## 2015-01-29 NOTE — Progress Notes (Signed)
[facility] [address], [place], [place].

## 2015-01-29 NOTE — Pre-Procedure Instructions (Signed)
    Daniel SeaDavid E Conner  01/29/2015     Your procedure is scheduled on Wednesday, December 21..  [facility] at 10:15 A.M.               Your surgery or procedure is scheduled for 12:20 PM    Call this number if you have problems the morning of surgery281 [phone]                 For any other questions, please call [phone], Monday - Friday 8 AM - 4 PM.   Remember:  Do not eat food or drink liquids after midnight Tuesday, December 20.   Take these medicines the morning of surgery with A SIP OF WATER:  May take Oxycodone- Acetaminophen if needed and you can tolerate on an empty stomach.                Stop taking Claritin D, may take Claritin withtout Pseudoephedrine.  Stop taking Vitamins and Herbal Products (Fish Oil).  DO not take Aspirin or Aspirin products, Ibuprofen (Advil) or Naproxen (Aleve).   Do not wear jewelry, make-up or nail polish.   Do not wear lotions, powders, or perfumes.                Men may shave face and neck.   Do not bring valuables to the hospital.   [facility] is not responsible for any belongings or valuables.  Contacts, dentures or bridgework may not be worn into surgery.  Leave your suitcase in the car.  After surgery it may be brought to your room.  For patients admitted to the hospital, discharge time will be determined by your treatment team.  Special instructions: Review  [facility] - Preparing For Surgery.  Please read over the following fact sheets that you were given. Pain Booklet, Coughing and Deep Breathing, Blood Transfusion Information and Surgical Site Infection Prevention

## 2015-02-03 MED ORDER — DEXTROSE 5 % IV SOLN
3.0000 g | INTRAVENOUS | Status: DC
  Filled 2015-02-03: qty 3000

## 2015-02-04 ENCOUNTER — Inpatient Hospital Stay (HOSPITAL_COMMUNITY): Admission: RE | Admit: 2015-02-04 | Payer: BLUE CROSS/BLUE SHIELD | Source: Ambulatory Visit | Admitting: Neurosurgery

## 2015-02-04 ENCOUNTER — Encounter (HOSPITAL_COMMUNITY): Admission: RE | Payer: Self-pay | Source: Ambulatory Visit

## 2015-02-04 SURGERY — POSTERIOR LUMBAR FUSION 1 LEVEL
Anesthesia: General

## 2015-05-01 DIAGNOSIS — Z029 Encounter for administrative examinations, unspecified: Secondary | ICD-10-CM

## 2016-04-07 ENCOUNTER — Ambulatory Visit (INDEPENDENT_AMBULATORY_CARE_PROVIDER_SITE_OTHER): Payer: Self-pay | Admitting: Family Medicine

## 2016-04-07 ENCOUNTER — Encounter: Payer: Self-pay | Admitting: Family Medicine

## 2016-04-07 VITALS — BP 170/88 | Ht 70.5 in | Wt 290.0 lb

## 2016-04-07 DIAGNOSIS — F321 Major depressive disorder, single episode, moderate: Secondary | ICD-10-CM | POA: Insufficient documentation

## 2016-04-07 DIAGNOSIS — Z13220 Encounter for screening for lipoid disorders: Secondary | ICD-10-CM

## 2016-04-07 DIAGNOSIS — Z125 Encounter for screening for malignant neoplasm of prostate: Secondary | ICD-10-CM

## 2016-04-07 DIAGNOSIS — R5383 Other fatigue: Secondary | ICD-10-CM

## 2016-04-07 DIAGNOSIS — Z0000 Encounter for general adult medical examination without abnormal findings: Secondary | ICD-10-CM

## 2016-04-07 DIAGNOSIS — I10 Essential (primary) hypertension: Secondary | ICD-10-CM | POA: Insufficient documentation

## 2016-04-07 MED ORDER — FLUOXETINE HCL 20 MG PO TABS: 20.0000 mg | ORAL_TABLET | Freq: Every day | ORAL | 3 refills | Status: DC

## 2016-04-07 NOTE — Progress Notes (Signed)
Subjective:    Patient ID: Daniel Conner, male    DOB: April 27, 1960, 56 y.o.   MRN: [id number] Patient arrives after multi year hiatus for a wellness exam HPI The patient comes in today for a wellness visit.    A review of their health history was completed.  A review of medications was also completed.  Any needed refills;N/A  Eating habits: good  Falls/  MVA accidents in past few months: none  Regular exercise: none  Specialist pt sees on regular basis: none  Preventative health issues were discussed.   Additional concerns: none  Last colonoscopy was 6 years ago.    Hx of sciatica don left leg, ended up not having  No screening tests  Trying to eercise, [place], uses exercise bike,  Five d per wk, trying for three   Colonoscopy done done six yrs ago, fa had colon c hx  Diet not the best ',,m  Pt would like to get bk into the work force, but feels not able to do with back and leg   Fa passed away fom alzheimer lately  Depression screen revealed an element of moderate depression. No suicidal thoughts. At times has "what's the use feelings, but states would not act upon them. Has been unemployed for a couple years. Also residual left leg sciatica which she feels leaves him unable to do many jobs.  Positive history of elevated blood pressure in the past. Generally goes down after recheck  Review of Systems  Constitutional: Negative for activity change, appetite change and fever.  HENT: Negative for congestion and rhinorrhea.   Eyes: Negative for discharge.  Respiratory: Negative for cough and wheezing.   Cardiovascular: Negative for chest pain.  Gastrointestinal: Negative for abdominal pain, blood in stool and vomiting.  Genitourinary: Negative for difficulty urinating and frequency.  Musculoskeletal: Negative for neck pain.  Skin: Negative for rash.  Allergic/Immunologic: Negative for environmental allergies and food allergies.  Neurological:  Negative for weakness and headaches.  Psychiatric/Behavioral: Negative for agitation.  All other systems reviewed and are negative.      Objective:   Physical Exam  Constitutional: He appears well-developed and well-nourished.  Morbid obesity present. Blood pressure still elevated at 152/90 on repeat  HENT:  Head: Normocephalic and atraumatic.  Right Ear: External ear normal.  Left Ear: External ear normal.  Nose: Nose normal.  Mouth/Throat: Oropharynx is clear and moist.  Eyes: EOM are normal. Pupils are equal, round, and reactive to light.  Neck: Normal range of motion. Neck supple. No thyromegaly present.  Cardiovascular: Normal rate, regular rhythm and normal heart sounds.   No murmur heard. Pulmonary/Chest: Effort normal and breath sounds normal. No respiratory distress. He has no wheezes.  Abdominal: Soft. Bowel sounds are normal. He exhibits no distension and no mass. There is no tenderness.  Genitourinary: Penis normal.  Musculoskeletal: Normal range of motion. He exhibits no edema.  Lymphadenopathy:    He has no cervical adenopathy.  Neurological: He is alert. He exhibits normal muscle tone.  Skin: Skin is warm and dry. No erythema.  Psychiatric: He has a normal mood and affect. His behavior is normal. Judgment normal.  Vitals reviewed.         Assessment & Plan:  Impression 1  Wellness exam #2 hypertension discussed time to declarsis #3depression moderate. No suicidal thoughts. Medicine discuss patient willing plan advise colonoscopy patient states cannot afford. Positive family history of colon cancer. Blood work encourage patient states will do.  Prozac initiated. Return in 3 months of blood pressure stil elevated will initiate medicine for blood pressure

## 2016-04-07 NOTE — Patient Instructions (Signed)
Exercising to Lose Weight Introduction Exercising can help you to lose weight. In order to lose weight through exercise, you need to do vigorous-intensity exercise. You can tell that you are exercising with vigorous intensity if you are breathing very hard and fast and cannot hold a conversation while exercising. Moderate-intensity exercise helps to maintain your current weight. You can tell that you are exercising at a moderate level if you have a higher heart rate and faster breathing, but you are still able to hold a conversation. How often should I exercise? Choose an activity that you enjoy and set realistic goals. Your health care provider can help you to make an activity plan that works for you. Exercise regularly as directed by your health care provider. This may include:  Doing resistance training twice each week, such as:  Push-ups.  Sit-ups.  Lifting weights.  Using resistance bands.  Doing a given intensity of exercise for a given amount of time. Choose from these options:  150 minutes of moderate-intensity exercise every week.  75 minutes of vigorous-intensity exercise every week.  A mix of moderate-intensity and vigorous-intensity exercise every week. Children, pregnant women, people who are out of shape, people who are overweight, and older adults may need to consult a health care provider for individual recommendations. If you have any sort of medical condition, be sure to consult your health care provider before starting a new exercise program. What are some activities that can help me to lose weight?  Walking at a rate of at least 4.5 miles an hour.  Jogging or running at a rate of 5 miles per hour.  Biking at a rate of at least 10 miles per hour.  Lap swimming.  Roller-skating or in-line skating.  Cross-country skiing.  Vigorous competitive sports, such as football, basketball, and soccer.  Jumping rope.  Aerobic dancing. How can I be more active in my  day-to-day activities?  Use the stairs instead of the elevator.  Take a walk during your lunch break.  If you drive, park your car farther away from work or school.  If you take public transportation, get off one stop early and walk the rest of the way.  Make all of your phone calls while standing up and walking around.  Get up, stretch, and walk around every 30 minutes throughout the day. What guidelines should I follow while exercising?  Do not exercise so much that you hurt yourself, feel dizzy, or get very short of breath.  Consult your health care provider prior to starting a new exercise program.  Wear comfortable clothes and shoes with good support.  Drink plenty of water while you exercise to prevent dehydration or heat stroke. Body water is lost during exercise and must be replaced.  Work out until you breathe faster and your heart beats faster. This information is not intended to replace advice given to you by your health care provider. Make sure you discuss any questions you have with your health care provider. Document Released: 03/05/2010 Document Revised: 07/09/2015 Document Reviewed: 07/04/2013  2017 Elsevier Hypertension Hypertension, commonly called high blood pressure, is when the force of blood pumping through your arteries is too strong. Your arteries are the blood vessels that carry blood from your heart throughout your body. A blood pressure reading consists of a higher number over a lower number, such as 110/72. The higher number (systolic) is the pressure inside your arteries when your heart pumps. The lower number (diastolic) is the pressure inside your arteries  when your heart relaxes. Ideally you want your blood pressure below 120/80. Hypertension forces your heart to work harder to pump blood. Your arteries may become narrow or stiff. Having untreated or uncontrolled hypertension can cause heart attack, stroke, kidney disease, and other problems. What  increases the risk? Some risk factors for high blood pressure are controllable. Others are not. Risk factors you cannot control include:  Race. You may be at higher risk if you are African American.  Age. Risk increases with age.  Gender. Men are at higher risk than women before age 65 years. After age 35, women are at higher risk than men. Risk factors you can control include:  Not getting enough exercise or physical activity.  Being overweight.  Getting too much fat, sugar, calories, or salt in your diet.  Drinking too much alcohol. What are the signs or symptoms? Hypertension does not usually cause signs or symptoms. Extremely high blood pressure (hypertensive crisis) may cause headache, anxiety, shortness of breath, and nosebleed. How is this diagnosed? To check if you have hypertension, your health care provider will measure your blood pressure while you are seated, with your arm held at the level of your heart. It should be measured at least twice using the same arm. Certain conditions can cause a difference in blood pressure between your right and left arms. A blood pressure reading that is higher than normal on one occasion does not mean that you need treatment. If it is not clear whether you have high blood pressure, you may be asked to return on a different day to have your blood pressure checked again. Or, you may be asked to monitor your blood pressure at home for 1 or more weeks. How is this treated? Treating high blood pressure includes making lifestyle changes and possibly taking medicine. Living a healthy lifestyle can help lower high blood pressure. You may need to change some of your habits. Lifestyle changes may include:  Following the DASH diet. This diet is high in fruits, vegetables, and whole grains. It is low in salt, red meat, and added sugars.  Keep your sodium intake below 2,300 mg per day.  Getting at least 30-45 minutes of aerobic exercise at least 4 times  per week.  Losing weight if necessary.  Not smoking.  Limiting alcoholic beverages.  Learning ways to reduce stress. Your health care provider may prescribe medicine if lifestyle changes are not enough to get your blood pressure under control, and if one of the following is true:  You are 8-43 years of age and your systolic blood pressure is above 140.  You are 68 years of age or older, and your systolic blood pressure is above 150.  Your diastolic blood pressure is above 90.  You have diabetes, and your systolic blood pressure is over 140 or your diastolic blood pressure is over 90.  You have kidney disease and your blood pressure is above 140/90.  You have heart disease and your blood pressure is above 140/90. Your personal target blood pressure may vary depending on your medical conditions, your age, and other factors. Follow these instructions at home:  Have your blood pressure rechecked as directed by your health care provider.  Take medicines only as directed by your health care provider. Follow the directions carefully. Blood pressure medicines must be taken as prescribed. The medicine does not work as well when you skip doses. Skipping doses also puts you at risk for problems.  Do not smoke.  Monitor your  blood pressure at home as directed by your health care provider. Contact a health care provider if:  You think you are having a reaction to medicines taken.  You have recurrent headaches or feel dizzy.  You have swelling in your ankles.  You have trouble with your vision. Get help right away if:  You develop a severe headache or confusion.  You have unusual weakness, numbness, or feel faint.  You have severe chest or abdominal pain.  You vomit repeatedly.  You have trouble breathing. This information is not intended to replace advice given to you by your health care provider. Make sure you discuss any questions you have with your health care  provider. Document Released: 01/31/2005 Document Revised: 07/09/2015 Document Reviewed: 11/23/2012 Elsevier Interactive Patient Education  2017 [employer].

## 2016-04-12 LAB — HEPATIC FUNCTION PANEL
ALT: 59 IU/L — ABNORMAL HIGH (ref 0–44)
AST: 37 IU/L (ref 0–40)
Albumin: 4.3 g/dL (ref 3.5–5.5)
Alkaline Phosphatase: 80 IU/L (ref 39–117)
Bilirubin Total: 0.6 mg/dL (ref 0.0–1.2)
Bilirubin, Direct: 0.15 mg/dL (ref 0.00–0.40)
Total Protein: 7.3 g/dL (ref 6.0–8.5)

## 2016-04-12 LAB — BASIC METABOLIC PANEL WITH GFR
BUN/Creatinine Ratio: 14 (ref 9–20)
BUN: 13 mg/dL (ref 6–24)
CO2: 23 mmol/L (ref 18–29)
Calcium: 9.5 mg/dL (ref 8.7–10.2)
Chloride: 101 mmol/L (ref 96–106)
Creatinine, Ser: 0.94 mg/dL (ref 0.76–1.27)
GFR calc Af Amer: 104 mL/min/1.73
GFR calc non Af Amer: 90 mL/min/1.73
Glucose: 119 mg/dL — ABNORMAL HIGH (ref 65–99)
Potassium: 4.7 mmol/L (ref 3.5–5.2)
Sodium: 142 mmol/L (ref 134–144)

## 2016-04-12 LAB — LIPID PANEL
Chol/HDL Ratio: 3.5 ratio (ref 0.0–5.0)
Cholesterol, Total: 162 mg/dL (ref 100–199)
HDL: 46 mg/dL
LDL Calculated: 91 mg/dL (ref 0–99)
Triglycerides: 124 mg/dL (ref 0–149)
VLDL Cholesterol Cal: 25 mg/dL (ref 5–40)

## 2016-04-12 LAB — PSA: Prostate Specific Ag, Serum: 0.9 ng/mL (ref 0.0–4.0)

## 2016-04-17 ENCOUNTER — Encounter: Payer: Self-pay | Admitting: Family Medicine

## 2016-05-04 ENCOUNTER — Telehealth: Payer: Self-pay | Admitting: Family Medicine

## 2016-05-04 NOTE — Telephone Encounter (Signed)
Spoke with a patient and informed him per Dr.Steve Luking- letter mailed on yesterday with results. Patient verbalized understanding.

## 2016-05-04 NOTE — Telephone Encounter (Signed)
Pt is wanting to know the results to his recent lab work.  

## 2016-05-04 NOTE — Telephone Encounter (Signed)
Letter sent yest with results

## 2016-07-05 ENCOUNTER — Ambulatory Visit: Payer: Self-pay | Admitting: Family Medicine

## 2016-08-10 ENCOUNTER — Ambulatory Visit: Payer: Self-pay | Admitting: Family Medicine

## 2016-08-16 ENCOUNTER — Encounter: Payer: Self-pay | Admitting: Family Medicine

## 2017-06-05 ENCOUNTER — Encounter: Payer: Self-pay | Admitting: Family Medicine

## 2017-06-05 ENCOUNTER — Ambulatory Visit (INDEPENDENT_AMBULATORY_CARE_PROVIDER_SITE_OTHER): Payer: Self-pay | Admitting: Family Medicine

## 2017-06-05 VITALS — BP 126/84 | Temp 99.0°F | Ht 70.5 in | Wt 280.2 lb

## 2017-06-05 DIAGNOSIS — K529 Noninfective gastroenteritis and colitis, unspecified: Secondary | ICD-10-CM

## 2017-06-05 DIAGNOSIS — J329 Chronic sinusitis, unspecified: Secondary | ICD-10-CM

## 2017-06-05 MED ORDER — DIPHENOXYLATE-ATROPINE 2.5-0.025 MG PO TABS
1.0000 | ORAL_TABLET | Freq: Four times a day (QID) | ORAL | 0 refills | Status: DC | PRN
Start: 2017-06-05 — End: 2018-02-16

## 2017-06-05 MED ORDER — ALBUTEROL SULFATE HFA 108 (90 BASE) MCG/ACT IN AERS: 2.0000 | INHALATION_SPRAY | Freq: Four times a day (QID) | RESPIRATORY_TRACT | 2 refills | Status: DC | PRN

## 2017-06-05 MED ORDER — AMOXICILLIN 500 MG PO CAPS: 500.0000 mg | ORAL_CAPSULE | Freq: Three times a day (TID) | ORAL | 0 refills | Status: DC

## 2017-06-05 NOTE — Patient Instructions (Signed)
Probiotic otc cheapest on [facility]

## 2017-06-05 NOTE — Progress Notes (Signed)
   Subjective:    Patient ID: Daniel Conner, male    DOB: Apr 26, 1960, 57 y.o.   MRN: [id number]  Cough  This is a new problem. The current episode started in the past 7 days. Associated symptoms include headaches, nasal congestion, a sore throat and wheezing. He has tried OTC cough suppressant for the symptoms.   Bad headaches  Cough significant  occas productive  Had some wheezing  No energy  Appetite dimished   Not as goo d  No sig s wheezy hx   Review of Systems  HENT: Positive for sore throat.   Respiratory: Positive for cough and wheezing.   Neurological: Positive for headaches.       Objective:   Physical Exam Alert, mild malaise. Hydration good Vitals stable. frontal/ maxillary tenderness evident positive nasal congestion. pharynx normal neck supple  lungs clear/no crackles or wheezes. heart regular in rhythm Abdominal exam benign no discrete tenderness       Assessment & Plan:  Impression rhinosinusitis likely post viral, discussed with patient. plan antibiotics prescribed. Questions answered. Symptomatic care discussed. warning signs discussed. WSL Also gastroenteritis.  All post flu.  Discussed.  Add Lomotil as needed for diarrhea.  Expect gradual resolution work excuse given

## 2018-02-16 ENCOUNTER — Ambulatory Visit (INDEPENDENT_AMBULATORY_CARE_PROVIDER_SITE_OTHER): Payer: BLUE CROSS/BLUE SHIELD | Admitting: Family Medicine

## 2018-02-16 ENCOUNTER — Encounter: Payer: Self-pay | Admitting: Family Medicine

## 2018-02-16 ENCOUNTER — Ambulatory Visit (HOSPITAL_COMMUNITY)
Admission: RE | Admit: 2018-02-16 | Discharge: 2018-02-16 | Disposition: A | Discharge status: Home / Self Care | Payer: BLUE CROSS/BLUE SHIELD | Source: Ambulatory Visit | Attending: Family Medicine | Admitting: Family Medicine

## 2018-02-16 VITALS — BP 146/92 | Wt 292.4 lb

## 2018-02-16 DIAGNOSIS — M25561 Pain in right knee: Secondary | ICD-10-CM | POA: Diagnosis not present

## 2018-02-16 DIAGNOSIS — S8991XA Unspecified injury of right lower leg, initial encounter: Secondary | ICD-10-CM | POA: Diagnosis not present

## 2018-02-16 MED ORDER — ETODOLAC 400 MG PO TABS: 400.0000 mg | ORAL_TABLET | Freq: Two times a day (BID) | ORAL | 0 refills | Status: DC

## 2018-02-16 NOTE — Patient Instructions (Addendum)
RICE Therapy for Routine Care of Injuries  Many injuries can be cared for with rest, ice, compression, and elevation (RICE therapy). This includes:   Resting the injured part.   Putting ice on the injury.   Putting pressure (compression) on the injury.   Raising the injured part (elevation).  Using RICE therapy can help to lessen pain and swelling.  Supplies needed:   Ice.   Plastic bag.   Towel.   Elastic bandage.   Pillow or pillows to raise (elevate) your injured body part.  How to care for your injury with RICE therapy  Rest  Limit your normal activities, and try not to use the injured part of your body. You can go back to your normal activities when your doctor says it is okay to do them and you feel okay. Ask your doctor if you should do exercises to help your injury get better.  Ice  Put ice on the injured area. Do not put ice on your bare skin.   Put ice in a plastic bag.   Place a towel between your skin and the bag.   Leave the ice on for 20 minutes, 2-3 times a day. Use ice on as many days as told by your doctor.    Compression  Compression means putting pressure on the injured area. This can be done with an elastic bandage. If an elastic bandage has been put on your injury:   Do not wrap the bandage too tight. Wrap the bandage more loosely if part of your body away from the bandage is blue, swollen, cold, painful, or loses feeling (gets numb).   Take off the bandage and put it on again. Do this every 3-4 hours or as told by your doctor.   See your doctor if the bandage seems to make your problems worse.    Elevation  Elevation means keeping the injured area raised. If you can, raise the injured area above your heart or the center of your chest.  Contact a doctor if:   You keep having pain and swelling.   Your symptoms get worse.  Get help right away if:   You have sudden bad pain at your injury or lower than your injury.   You have redness or more swelling around your injury.   You  have tingling or numbness at your injury or lower than your injury, and it does not go away when you take off the bandage.  Summary   Many injuries can be cared for using rest, ice, compression, and elevation (RICE therapy).   You can go back to your normal activities when you feel okay and your doctor says it is okay.   Put ice on the injured area as told by your doctor.   Get help if your symptoms get worse or if you keep having pain and swelling.  This information is not intended to replace advice given to you by your health care provider. Make sure you discuss any questions you have with your health care provider.  Document Released: 07/20/2007 Document Revised: 10/21/2016 Document Reviewed: 10/21/2016  Elsevier Interactive Patient Education  2019 [employer]

## 2018-02-16 NOTE — Progress Notes (Signed)
   Subjective:    Patient ID: Daniel Conner, male    DOB: [birth year], 58 y.o.   MRN: [id number]  Knee Pain   The incident occurred 5 to 7 days ago. The incident occurred at home. The injury mechanism was a fall. The pain is present in the right knee. Associated symptoms include an inability to bear weight. Pertinent negatives include no numbness. He has tried NSAIDs for the symptoms. The treatment provided mild relief.   Reports 1 week ago tripped walking up sidewalk and fell landing forward. Did not land on knee. Initially was sore all over with back pain. Back pain has resolved. Over the last few days having increasing right knee pain, difficult to bear weight.   Has tried ibuprofen, 2-3 tablets q 4-5 hours. Not very helpful.   No hx of prior knee injuries. On feet a lot at work - 12 hr shifts.    Review of Systems  Musculoskeletal: Positive for arthralgias and joint swelling.  Neurological: Negative for weakness and numbness.       Objective:   Physical Exam Vitals signs and nursing note reviewed. Exam conducted with a chaperone present.  Constitutional:      General: He is not in acute distress.    Appearance: He is well-developed.  HENT:     Head: Normocephalic and atraumatic.  Neck:     Musculoskeletal: Neck supple.  Cardiovascular:     Rate and Rhythm: Normal rate and regular rhythm.     Heart sounds: Normal heart sounds. No murmur.  Pulmonary:     Effort: Pulmonary effort is normal. No respiratory distress.     Breath sounds: Normal breath sounds.  Musculoskeletal:     Comments: Right knee: Edema noted, no warmth or erythema noted. Tender over medial joint line. No laxity on ligamentous exam. Pain with negative mechanical signs on McMurray's. Pain with full flexion or full extension of knee. Distal pulses intact. Antalgic gait.   Skin:    General: Skin is warm and dry.  Neurological:     Mental Status: He is alert and oriented to person, place, and time.            Assessment & Plan:  Injury of right knee, initial encounter - Plan: DG Knee Complete 4 Views Right  Will obtain x-ray of right knee. Treat with rx strength anti-inflammatory. Warning signs discussed. He is scheduled to go back to work on Tuesday 02/20/18, he will call on Monday if he is still having significant pain. Symptomatic care discussed. Will f/u based on x-ray results. Suspect medial mensicus injury, will likely need further workup.   Discussed elevated BP with patient at this visit, pt will check BP outside of office and keep a log of it. Will recheck at f/u. Likely elevated d/t pain at this time. Hx of HTN, not currently on any medications.   Dr. Lubertha South was consulted on this case and is in agreement with the above treatment plan.

## 2018-02-19 ENCOUNTER — Telehealth: Payer: Self-pay | Admitting: Family Medicine

## 2018-02-19 ENCOUNTER — Other Ambulatory Visit: Payer: Self-pay | Admitting: Family Medicine

## 2018-02-19 ENCOUNTER — Encounter: Payer: Self-pay | Admitting: Family Medicine

## 2018-02-19 DIAGNOSIS — S8991XA Unspecified injury of right lower leg, initial encounter: Secondary | ICD-10-CM

## 2018-02-19 MED ORDER — TRAMADOL HCL 50 MG PO TABS: ORAL_TABLET | ORAL | 0 refills | Status: DC

## 2018-02-19 NOTE — Telephone Encounter (Signed)
Patient was seen on 1/3 with knee pain and was prescribe muscle relaxer he states not helping with pain and wanting something else called in for pain. [facility] [place]

## 2018-02-19 NOTE — Telephone Encounter (Signed)
Sent in tramadol to take as needed to the pharmacy listed. Caution drowsiness with this medication, make sure he does not take and operate heavy machinery or drive while on medication.  Since he is still having persistent pain I do recommend a referral to orthopedics, suspect there's a strong likelihood that there's an internal injury going on like a tear to his meniscus that would need further work up.   Thanks!

## 2018-02-19 NOTE — Telephone Encounter (Signed)
Patient notified and verbalized understanding. Referral ordered in Epic. °

## 2018-02-19 NOTE — Progress Notes (Signed)
PMP database checked.

## 2018-02-19 NOTE — Telephone Encounter (Signed)
Patient seen 02/16/2018 and prescribed Lodine 400 mg BID

## 2018-02-21 ENCOUNTER — Telehealth: Payer: Self-pay | Admitting: Family Medicine

## 2018-02-21 NOTE — Telephone Encounter (Signed)
Patient has Hydrocodone listed as allergy- it causes dizziness and light headedness please advise

## 2018-02-21 NOTE — Telephone Encounter (Signed)
Ho w is that ortho referral going? hydrocod 5/325 one q four to six prn drowny prec numb 28

## 2018-02-21 NOTE — Telephone Encounter (Signed)
That's a problem, can he handle oxycodone or percocet? Call him

## 2018-02-21 NOTE — Telephone Encounter (Signed)
Patient states he can take oxycodone

## 2018-02-21 NOTE — Telephone Encounter (Signed)
Pt states traMADol (ULTRAM) 50 MG tablet is not working for (R) knee pain, pt states he would like something else called in if possible.    Pharmacy:  [id number] - [place], [place] - [address]

## 2018-02-22 MED ORDER — OXYCODONE-ACETAMINOPHEN 7.5-325 MG PO TABS: ORAL_TABLET | ORAL | 0 refills | Status: DC

## 2018-02-22 NOTE — Telephone Encounter (Signed)
Pt calling checking status of pain meds being called in

## 2018-02-22 NOTE — Telephone Encounter (Signed)
done

## 2018-02-22 NOTE — Telephone Encounter (Signed)
Referral was sent over to ortho office today to get appt.

## 2018-02-22 NOTE — Telephone Encounter (Signed)
Patient is aware medication is being sent in and the status of the referral.

## 2018-02-22 NOTE — Telephone Encounter (Signed)
oxycofdone 7.5/ acet 325 numb forty one up to qid prn pain  Specifically let me know status of ortho referral please

## 2018-02-27 DIAGNOSIS — M25561 Pain in right knee: Secondary | ICD-10-CM | POA: Diagnosis not present

## 2018-03-05 DIAGNOSIS — M25561 Pain in right knee: Secondary | ICD-10-CM | POA: Diagnosis not present

## 2018-03-09 DIAGNOSIS — S83231A Complex tear of medial meniscus, current injury, right knee, initial encounter: Secondary | ICD-10-CM | POA: Diagnosis not present

## 2019-02-15 IMAGING — DX DG KNEE COMPLETE 4+V*R*
4 series · 4 of 4 positions shown · non-contrast
Comparison: None.

CLINICAL DATA: Acute right knee pain after fall last week.

EXAM:
RIGHT KNEE - COMPLETE 4+ VIEW

[tunnel]
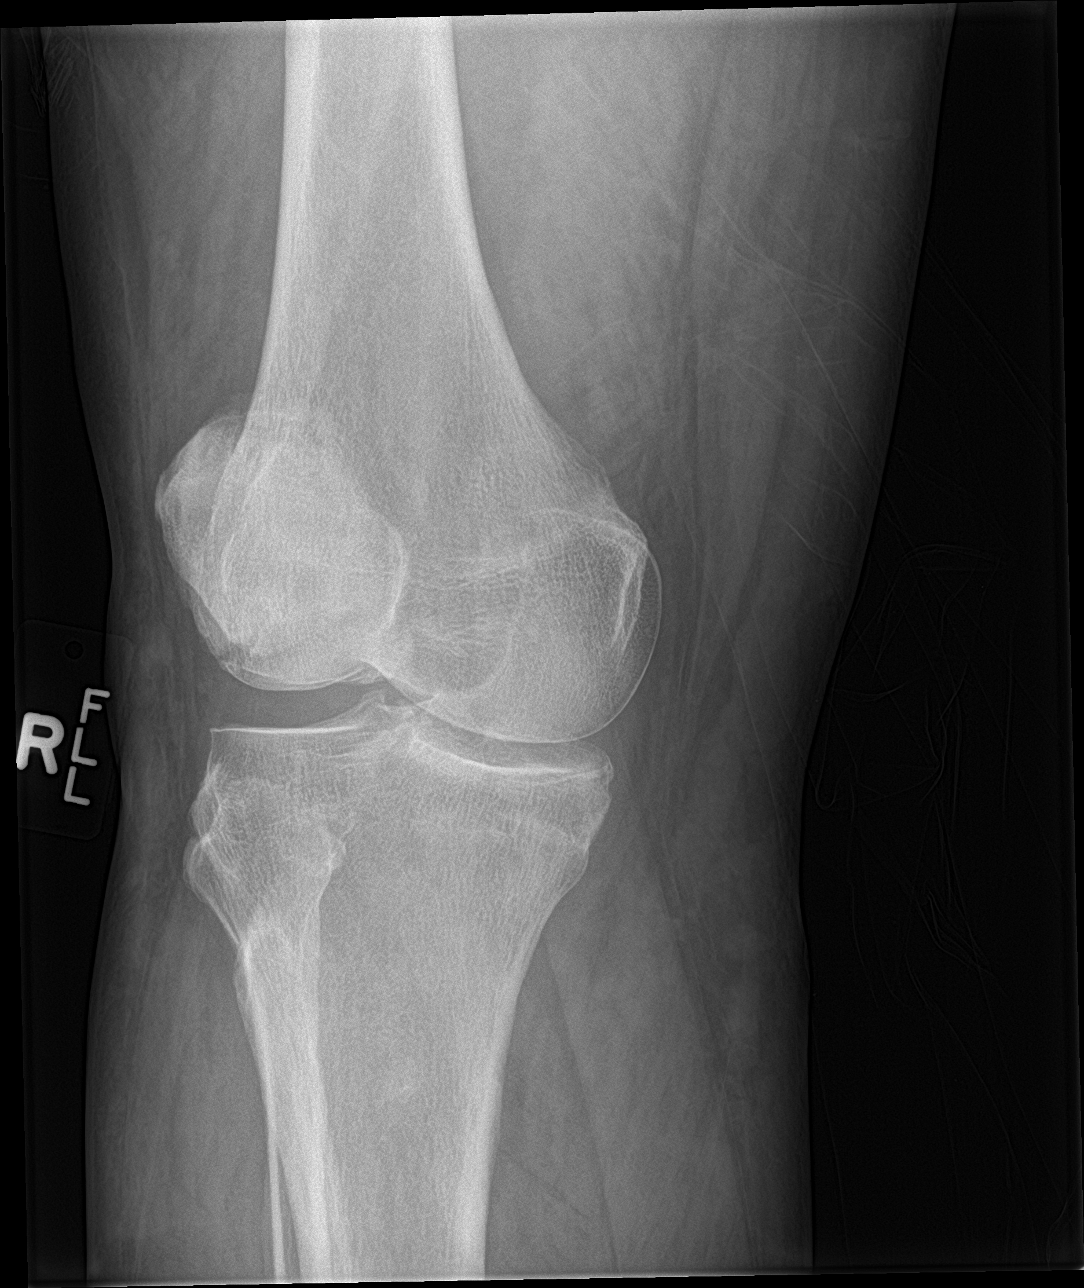

[knee lat]
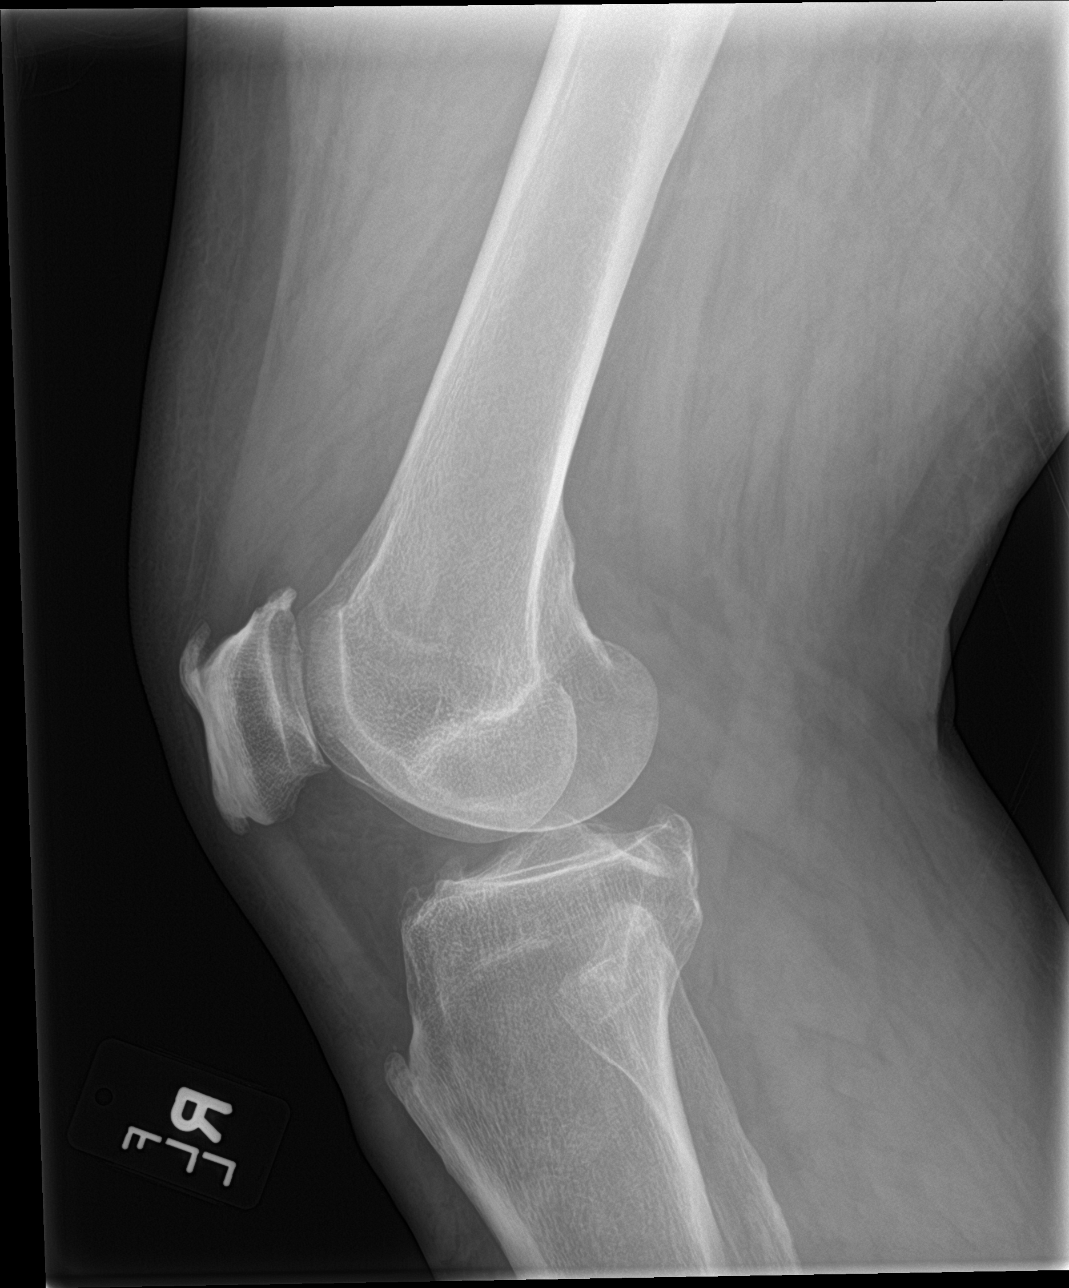

[knee sunrise]
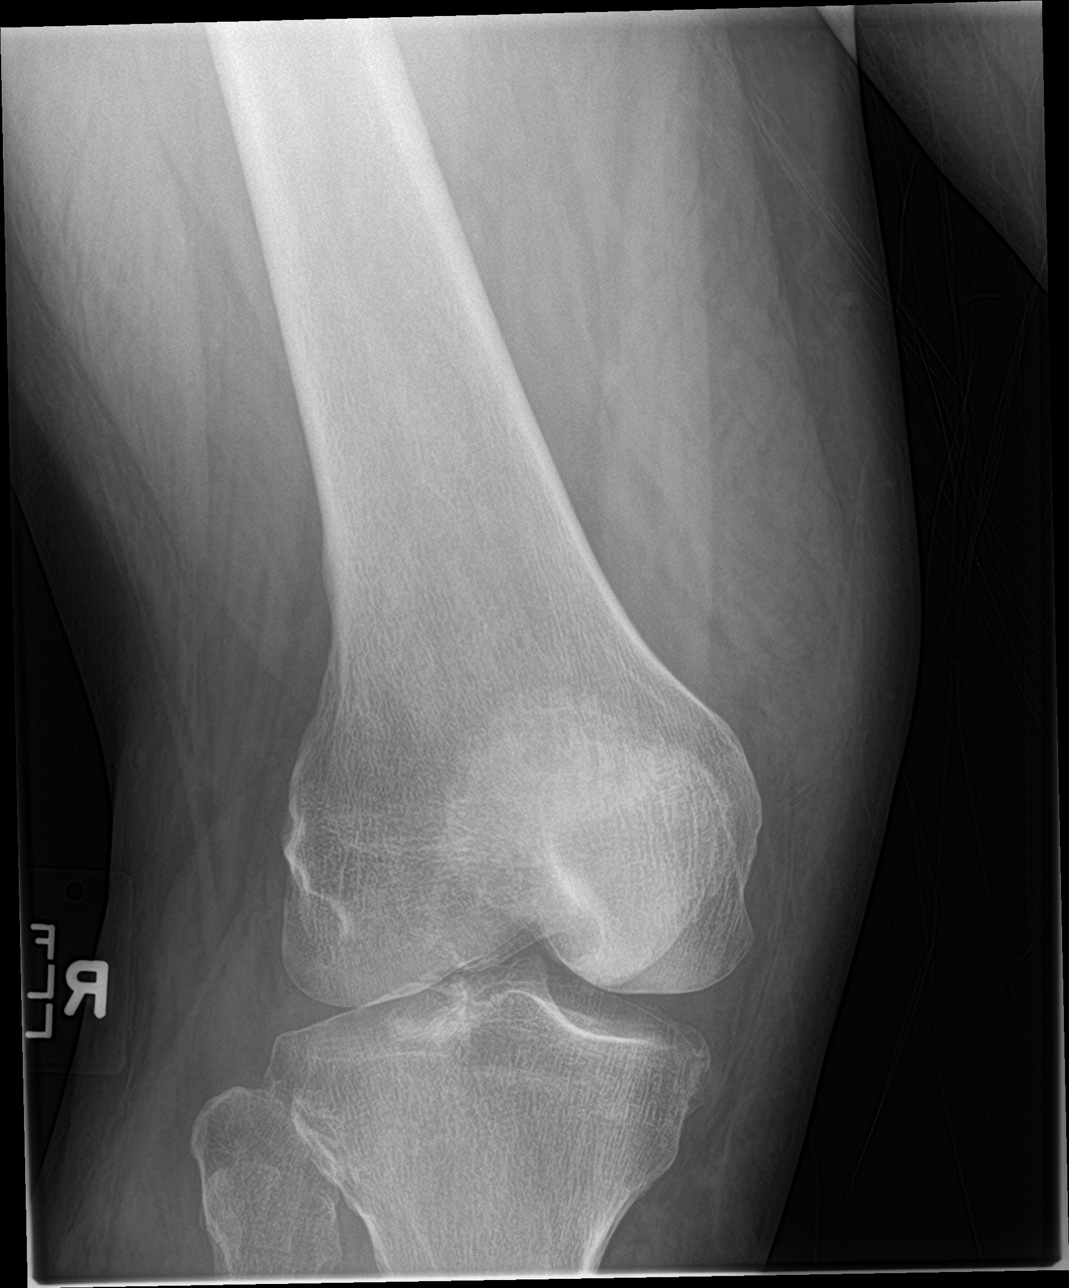

[knee ap]
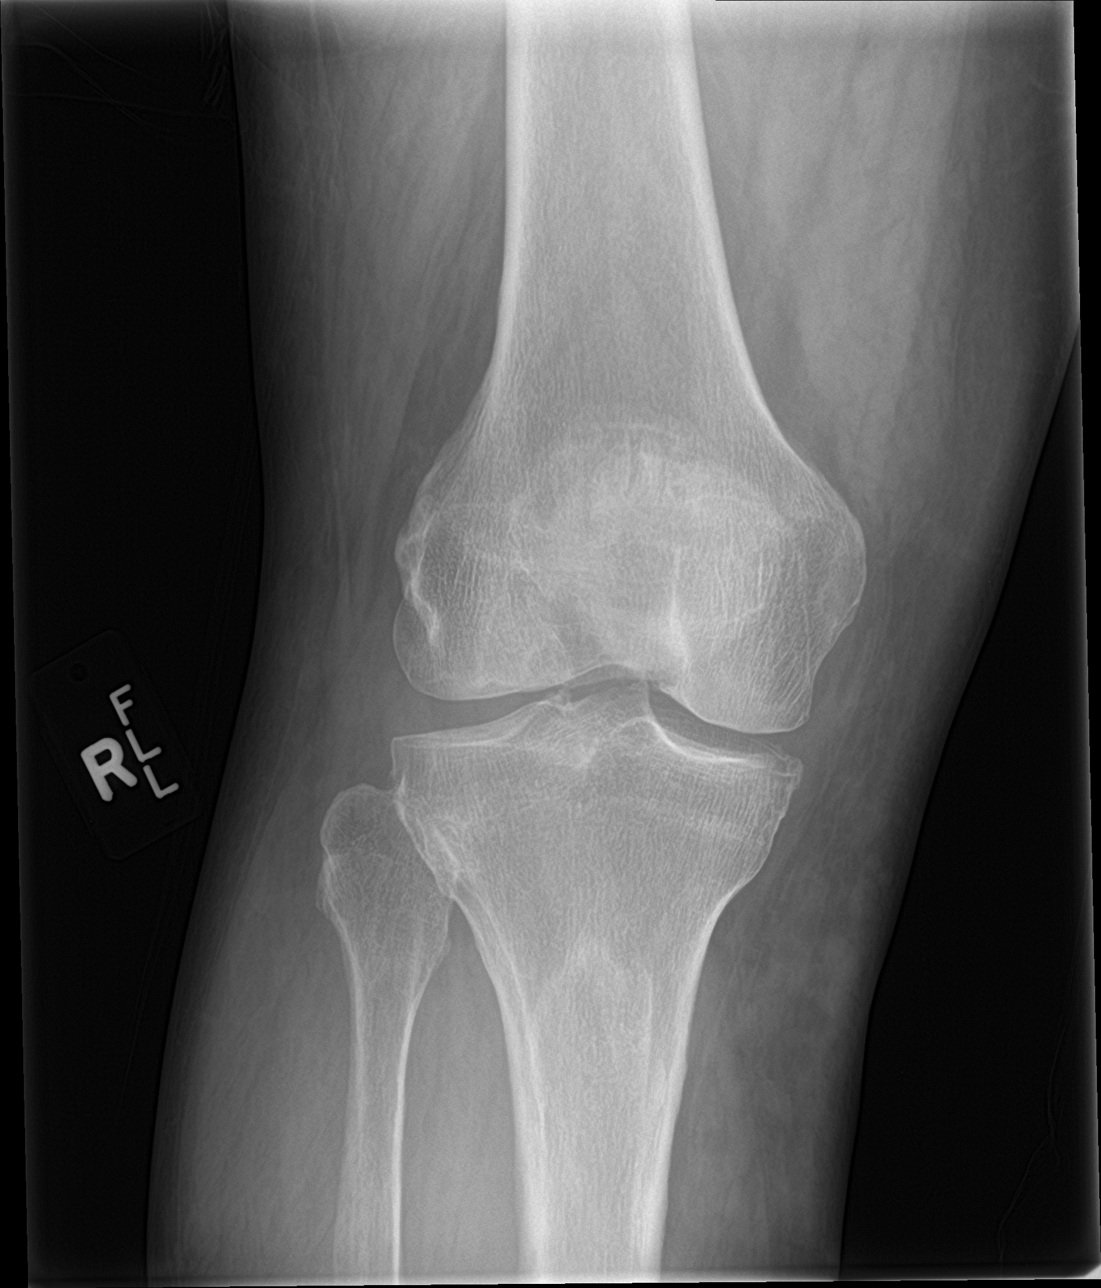

[4 of 4 positions shown; findings below may reference images not displayed]

FINDINGS: No evidence of fracture, dislocation, or joint effusion. No evidence
of arthropathy. Mild patellar spurring is noted. Soft tissues are
unremarkable.
IMPRESSION: No acute abnormality seen in the right knee.

## 2019-03-20 ENCOUNTER — Encounter: Payer: Self-pay | Admitting: Family Medicine

## 2022-10-24 ENCOUNTER — Ambulatory Visit: Payer: No Typology Code available for payment source | Admitting: Student

## 2022-10-31 ENCOUNTER — Ambulatory Visit: Payer: No Typology Code available for payment source | Admitting: Student

## 2022-11-01 ENCOUNTER — Encounter: Payer: Self-pay | Admitting: Student

## 2022-11-01 ENCOUNTER — Ambulatory Visit: Payer: No Typology Code available for payment source | Admitting: Student

## 2022-11-01 VITALS — BP 184/104 | HR 105 | Temp 98.7°F | Resp 20 | Ht 70.5 in | Wt 275.1 lb

## 2022-11-01 DIAGNOSIS — Z0000 Encounter for general adult medical examination without abnormal findings: Secondary | ICD-10-CM | POA: Insufficient documentation

## 2022-11-01 DIAGNOSIS — Z6838 Body mass index (BMI) 38.0-38.9, adult: Secondary | ICD-10-CM

## 2022-11-01 DIAGNOSIS — I10 Essential (primary) hypertension: Secondary | ICD-10-CM

## 2022-11-01 DIAGNOSIS — Z7689 Persons encountering health services in other specified circumstances: Secondary | ICD-10-CM

## 2022-11-01 DIAGNOSIS — R350 Frequency of micturition: Secondary | ICD-10-CM | POA: Diagnosis not present

## 2022-11-01 DIAGNOSIS — F321 Major depressive disorder, single episode, moderate: Secondary | ICD-10-CM

## 2022-11-01 NOTE — Assessment & Plan Note (Signed)
Return for blood work to include Hgba1c, BMP and CBC.

## 2022-11-01 NOTE — Assessment & Plan Note (Signed)
Patient here to establish care. We discussed preventative care and management. He will return next month for an annual physical.

## 2022-11-01 NOTE — Assessment & Plan Note (Signed)
His blood pressure is elevated today. I want him to return next week for bp recheck and labs.

## 2022-11-01 NOTE — Patient Instructions (Addendum)
Please return for a nurse visit for blood work (Hgba1c, electrolytes, and blood counts and urine).

## 2022-11-01 NOTE — Assessment & Plan Note (Signed)
Education provided. He will see me back to begin antidepressant.

## 2022-11-01 NOTE — Progress Notes (Signed)
New Patient Office Visit  Subjective    Patient ID: Daniel Conner, male    DOB: 1960/09/23  Age: 62 y.o. MRN: [id number]  CC:  Chief Complaint  Patient presents with   Establish Care    Urinary frequency several years. Last Bw was 4 years ago.  Constant thirst. Bowel issues x couple years. Last colonoscopy over 15 years ago.    HPI Daniel Conner presents to establish care. He presents here with his girlfriend. 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 It has been many years since his last visit. His past medical history includes HTN,   Hypertension: He reports history of hypertension but has never been on prescribed medications. Today his blood pressure is elevated at 180/104, prior to coming he says he had a flat tire and this could be the cause.  He denies shortness of breath, chest pain, and no lower extremity swelling. He endorses headaches every now and then. He does not check his blood pressure at home.   Depression: He would like to discuss options for resuming antidepressant therapies. He reports episodes of crying, feeling hopeless, and insomnia. last using an antidepressant in year 2020, he can't remember the med or dosage. Today he denies feelings of suicidal ideation, homicidal ideation, difficulty concentrating, difficulty performing routine daily activities, or panic attacks.   Urinary frequency: He wakes up around 3-4 times nightly to urinate. He has noticed symptoms increasing in the last several years. Drinking constantly throughout the day because he is thirsty.  He deenies incontinence, urgency, or dysuria.   Outpatient Encounter Medications as of 11/01/2022  Medication Sig   [DISCONTINUED] etodolac (LODINE) 400 MG tablet Take 1 tablet (400 mg total) by mouth 2 (two) times daily. With food.   [DISCONTINUED] oxyCODONE-acetaminophen (PERCOCET) 7.5-325 MG tablet One tablet up to QID PRN pain   [DISCONTINUED] traMADol (ULTRAM) 50 MG tablet 1 tab  po q 4-6 h prn moderate to severe pain   No facility-administered encounter medications on file as of 11/01/2022.    Past Medical History:  Diagnosis Date   Arthritis    Carpal tunnel syndrome    bilateral   ED (erectile dysfunction)    Fatty liver    GERD (gastroesophageal reflux disease)    occ when eat late at night   H/O degenerative disc disease    History of kidney stones    Shortness of breath dyspnea    witrh exertion    Past Surgical History:  Procedure Laterality Date   BACK SURGERY      x2   CHOLECYSTECTOMY     CYSTOSCOPY     LUMBAR LAMINECTOMY/DECOMPRESSION MICRODISCECTOMY N/A 07/04/2014   Procedure:  Left lumbar three- lumbar four microdiscectomy;  Surgeon: Coletta Memos, MD;  Location: [facility];  Service: Neurosurgery;  Laterality: N/A;  L34 microdiskectomy   NASAL POLYP SURGERY     SHOULDER ARTHROSCOPY Right    right   VASECTOMY      Family History  Problem Relation Age of Onset   Cancer Father        colon   COPD Father     Social History   Socioeconomic History   Marital status: Widowed    Spouse name: Not on file   Number of children: Not on file   Years of education: Not on file   Highest education level: Not on file  Occupational History   Not on file  Tobacco Use  Smoking status: Former    Types: Cigarettes, Cigars    Start date: 1982    Quit date: 2014    Years since quitting: 10.7   Smokeless tobacco: Never  Substance and Sexual Activity   Alcohol use: No    Alcohol/week: 0.0 standard drinks of alcohol    Comment: occ   Drug use: No   Sexual activity: Not Currently  Other Topics Concern   Not on file  Social History Narrative   Not on file   Social Determinants of Health   Financial Resource Strain: Not on file  Food Insecurity: Not on file  Transportation Needs: Not on file  Physical Activity: Not on file  Stress: Not on file  Social Connections: Not on file  Intimate Partner Violence: Not on file    Review of  Systems  Constitutional: Negative.   Eyes: Negative.   Respiratory: Negative.    Cardiovascular: Negative.   Gastrointestinal: Negative.   Genitourinary:  Positive for frequency.  Musculoskeletal: Negative.   Endo/Heme/Allergies:  Positive for polydipsia.  Psychiatric/Behavioral:  Positive for depression.         Objective    BP (!) 184/104   Pulse (!) 105   Temp 98.7 F (37.1 C)   Resp 20   Ht 5' 10.5" (1.791 m)   Wt 275 lb 2 oz (124.8 kg)   SpO2 97%   BMI 38.92 kg/m   Physical Exam Vitals reviewed.  Constitutional:      Appearance: Normal appearance. He is obese.  Cardiovascular:     Rate and Rhythm: Normal rate and regular rhythm.     Pulses: Normal pulses.     Heart sounds: Normal heart sounds.  Pulmonary:     Breath sounds: Normal breath sounds.  Abdominal:     General: Bowel sounds are normal.     Palpations: Abdomen is soft.     Tenderness: There is no abdominal tenderness. There is no right CVA tenderness, left CVA tenderness, guarding or rebound.  Musculoskeletal:        General: Normal range of motion.  Skin:    General: Skin is warm and dry.     Capillary Refill: Capillary refill takes less than 2 seconds.  Neurological:     Mental Status: He is alert and oriented to person, place, and time.  Psychiatric:        Attention and Perception: Attention normal.        Mood and Affect: Mood normal.        Speech: Speech normal.        Behavior: Behavior normal.        Thought Content: Thought content normal.        Cognition and Memory: Cognition normal.        Judgment: Judgment normal.       Assessment & Plan:   Problem List Items Addressed This Visit     Hypertension - Primary    His blood pressure is elevated today. I want him to return next week for bp recheck and labs.       Depression, major, single episode, moderate (HCC)    Education provided. He will see me back to begin antidepressant.       Encounter to establish care     Patient here to establish care. We discussed preventative care and management. He will return next month for an annual physical.       Urinary frequency    Return for blood work to include  Hgba1c, BMP and CBC.           Return in about 1 month (around 12/01/2022) for ANNUAL FASTING .   [name] Everet Flagg, NP  I have personally spent 32  minutes involved in face-to-face and non-face-to-face activities for this patient on the day of the visit. Professional time spent includes the following activities, in addition to those noted in the documentation:

## 2022-11-08 ENCOUNTER — Ambulatory Visit: Payer: No Typology Code available for payment source

## 2022-11-08 DIAGNOSIS — Z23 Encounter for immunization: Secondary | ICD-10-CM

## 2022-11-08 DIAGNOSIS — R799 Abnormal finding of blood chemistry, unspecified: Secondary | ICD-10-CM

## 2022-11-08 DIAGNOSIS — Z7689 Persons encountering health services in other specified circumstances: Secondary | ICD-10-CM

## 2022-11-08 DIAGNOSIS — Z0000 Encounter for general adult medical examination without abnormal findings: Secondary | ICD-10-CM

## 2022-11-08 NOTE — Addendum Note (Signed)
Addended by: Christianne Dolin on: 11/08/2022 09:25 AM   Modules accepted: Orders

## 2022-11-08 NOTE — Addendum Note (Signed)
Addended by: Christianne Dolin on: 11/08/2022 09:33 AM   Modules accepted: Level of Service

## 2022-11-08 NOTE — Progress Notes (Signed)
Nurse visit only for bloodwork and flu shot

## 2022-11-09 ENCOUNTER — Other Ambulatory Visit: Payer: Self-pay | Admitting: Student

## 2022-11-09 DIAGNOSIS — E1165 Type 2 diabetes mellitus with hyperglycemia: Secondary | ICD-10-CM

## 2022-11-09 DIAGNOSIS — E119 Type 2 diabetes mellitus without complications: Secondary | ICD-10-CM | POA: Insufficient documentation

## 2022-11-09 DIAGNOSIS — E1129 Type 2 diabetes mellitus with other diabetic kidney complication: Secondary | ICD-10-CM | POA: Insufficient documentation

## 2022-11-09 LAB — BASIC METABOLIC PANEL
BUN/Creatinine Ratio: 14 (ref 10–24)
BUN: 15 mg/dL (ref 8–27)
CO2: 21 mmol/L (ref 20–29)
Calcium: 9.4 mg/dL (ref 8.6–10.2)
Chloride: 102 mmol/L (ref 96–106)
Creatinine, Ser: 1.1 mg/dL (ref 0.76–1.27)
Glucose: 227 mg/dL — ABNORMAL HIGH (ref 70–99)
Potassium: 4.4 mmol/L (ref 3.5–5.2)
Sodium: 138 mmol/L (ref 134–144)
eGFR: 75 mL/min/{1.73_m2} (ref >59)

## 2022-11-09 LAB — CBC
Hematocrit: 43.9 % (ref 37.5–51.0)
Hemoglobin: 14.2 g/dL (ref 13.0–17.7)
MCH: 30.1 pg (ref 26.6–33.0)
MCHC: 32.3 g/dL (ref 31.5–35.7)
MCV: 93 fL (ref 79–97)
Platelets: 238 10*3/uL (ref 150–450)
RBC: 4.72 x10E6/uL (ref 4.14–5.80)
RDW: 13.3 % (ref 11.6–15.4)
WBC: 11.1 10*3/uL — ABNORMAL HIGH (ref 3.4–10.8)

## 2022-11-09 LAB — HEMOGLOBIN A1C
Est. average glucose Bld gHb Est-mCnc: 223 mg/dL
Hgb A1c MFr Bld: 9.4 % — ABNORMAL HIGH (ref 4.8–5.6)

## 2022-11-09 MED ORDER — LISINOPRIL 10 MG PO TABS: 10.0000 mg | ORAL_TABLET | Freq: Every day | ORAL | 3 refills | Status: DC

## 2022-11-09 MED ORDER — METFORMIN HCL ER 500 MG PO TB24: 500.0000 mg | ORAL_TABLET | Freq: Every day | ORAL | 1 refills | Status: DC

## 2022-11-09 NOTE — Progress Notes (Signed)
Leak, Daniel Nutting, NP  Christianne Dolin, CMA I have reviewed the results of your recent test and lab work that you underwent at our clinic. Findings indicate an hgba1c of 9.4 which is diabetes type II. All other labs appear normal.  For DM 2: Start Metformin 500 mg daily with breakfast. Starting it at nighttime may help minimize side effects (eg, anorexia, nausea, metallic taste in the mouth). In addition to this I would like to start you on injectable therapy to help bring your A1c down we can discuss this when you come back in 2 weeks.  For HTN: Start Lisinopril 10 mg daily. Decrease salt intake and make sure that you are drinking enough water.  These have been sent to the pharmacy.  Patient informed results and recommendations, appointment has been scheduled for follow up.

## 2022-11-09 NOTE — Progress Notes (Signed)
I have reviewed the results of your recent test and lab work that you underwent at our clinic. Findings indicate an hgba1c of 9.4 which is diabetes type II. All other labs appear normal.   For DM 2: Start Metformin 500 mg daily with breakfast. Starting it at nighttime may help minimize side effects (eg, anorexia, nausea, metallic taste in the mouth). In addition to this I would like to start you on injectable therapy to help bring your A1c down we can discuss this when you come back in 2 weeks.   For HTN: Start Lisinopril 10 mg daily. Decrease salt intake and make sure that you are drinking enough water.   These have been sent to the pharmacy.

## 2022-11-10 ENCOUNTER — Other Ambulatory Visit: Payer: Self-pay | Admitting: Student

## 2022-11-10 LAB — LIPID PANEL W/O CHOL/HDL RATIO
Cholesterol, Total: 229 mg/dL — ABNORMAL HIGH (ref 100–199)
HDL: 47 mg/dL (ref >39)
LDL Chol Calc (NIH): 133 mg/dL — ABNORMAL HIGH (ref 0–99)
Triglycerides: 273 mg/dL — ABNORMAL HIGH (ref 0–149)
VLDL Cholesterol Cal: 49 mg/dL — ABNORMAL HIGH (ref 5–40)

## 2022-11-10 LAB — TSH: TSH: 3.08 u[IU]/mL (ref 0.450–4.500)

## 2022-11-10 LAB — SPECIMEN STATUS REPORT

## 2022-11-10 MED ORDER — ATORVASTATIN CALCIUM 20 MG PO TABS: 20.0000 mg | ORAL_TABLET | Freq: Every day | ORAL | 2 refills | Status: DC

## 2022-11-10 NOTE — Progress Notes (Signed)
Please let him know that I have reviewed his cholesterol panel, the are elevated. I want him to try to avoid fried foods and fatty foods (red meats, pork products, whole milk products, and egg yolks) and try to eat more fresh fruits and vegetables and food items that are low fat and low cholesterol.   I am going to start him on atorvastatin to help decrease levels. We will recheck in about 6 months.

## 2022-11-10 NOTE — Progress Notes (Signed)
Leak, Luetta Nutting, NP  Christianne Dolin, CMA Please let him know that I have reviewed his cholesterol panel, the are elevated. I want him to try to avoid fried foods and fatty foods (red meats, pork products, whole milk products, and egg yolks) and try to eat more fresh fruits and vegetables and food items that are low fat and low cholesterol.  I am going to start him on atorvastatin to help decrease levels. We will recheck in about 6 months.  Patient informed, he actually has an appt. Oct. 15th, for Ozempic instruction, but will make f/up appt. When he comes in.

## 2022-11-11 ENCOUNTER — Other Ambulatory Visit: Payer: Self-pay

## 2022-11-11 MED ORDER — ATORVASTATIN CALCIUM 20 MG PO TABS: 20.0000 mg | ORAL_TABLET | Freq: Every day | ORAL | 2 refills | Status: DC

## 2022-11-11 MED ORDER — LISINOPRIL 10 MG PO TABS: 10.0000 mg | ORAL_TABLET | Freq: Every day | ORAL | 3 refills | Status: DC

## 2022-11-11 MED ORDER — METFORMIN HCL ER 500 MG PO TB24: 500.0000 mg | ORAL_TABLET | Freq: Every day | ORAL | 1 refills | Status: DC

## 2022-11-29 ENCOUNTER — Encounter: Payer: Self-pay | Admitting: Student

## 2022-11-29 ENCOUNTER — Ambulatory Visit: Payer: No Typology Code available for payment source | Admitting: Student

## 2022-11-29 VITALS — BP 144/86 | HR 74 | Temp 97.5°F | Resp 18 | Ht 70.5 in | Wt 272.5 lb

## 2022-11-29 DIAGNOSIS — I10 Essential (primary) hypertension: Secondary | ICD-10-CM | POA: Diagnosis not present

## 2022-11-29 DIAGNOSIS — Z0000 Encounter for general adult medical examination without abnormal findings: Secondary | ICD-10-CM | POA: Diagnosis not present

## 2022-11-29 DIAGNOSIS — E1165 Type 2 diabetes mellitus with hyperglycemia: Secondary | ICD-10-CM | POA: Diagnosis not present

## 2022-11-29 DIAGNOSIS — Z6838 Body mass index (BMI) 38.0-38.9, adult: Secondary | ICD-10-CM | POA: Diagnosis not present

## 2022-11-29 DIAGNOSIS — F321 Major depressive disorder, single episode, moderate: Secondary | ICD-10-CM

## 2022-11-29 DIAGNOSIS — Z1211 Encounter for screening for malignant neoplasm of colon: Secondary | ICD-10-CM

## 2022-11-29 MED ORDER — CLONIDINE HCL 0.1 MG PO TABS
0.1000 mg | ORAL_TABLET | Freq: Once | ORAL | Status: AC
  Administered 2022-11-29: 0.1 mg via ORAL

## 2022-11-29 MED ORDER — ESCITALOPRAM OXALATE 10 MG PO TABS: ORAL_TABLET | ORAL | 1 refills | Status: DC

## 2022-11-29 MED ORDER — LISINOPRIL 20 MG PO TABS: 20.0000 mg | ORAL_TABLET | Freq: Every day | ORAL | 11 refills | Status: DC

## 2022-11-29 MED ORDER — METFORMIN HCL ER (MOD) 1000 MG PO TB24: 1000.0000 mg | ORAL_TABLET | Freq: Every day | ORAL | 3 refills | Status: DC

## 2022-11-29 MED ORDER — ADULT BLOOD PRESSURE CUFF LG KIT: PACK | 0 refills | Status: AC

## 2022-11-29 NOTE — Assessment & Plan Note (Signed)
Discussed diet and exercise and the benefits. He needs to monitor his diet and increase activity.

## 2022-11-29 NOTE — Assessment & Plan Note (Signed)
He has tolerated Glucophage 500 mg once daily, for tighter control I am increasing this to 1000 mg daily. Needs a diabetic eye exam and or to see an optometrist. Labs collected today for microalbumin/creatinine ratio and hgba1c.

## 2022-11-29 NOTE — Assessment & Plan Note (Signed)
Depression screening was 17 today. He was using prozac back in 2018 but says it didn't do much for him. I will start him on Lexapro 10 mg for 4 days increasing to 20 mg daily. He will follow up in 3 months.

## 2022-11-29 NOTE — Progress Notes (Addendum)
Name: Daniel Conner   MRN: [id number]    DOB: 15-Jun-1960   Date:11/29/2022       Progress Note  Subjective  Chief Complaint  Chief Complaint  Patient presents with   Annual Exam    Annual physical, he is fasting    HPI  Patient presents for annual CPE with screening labs. .  IPSS Questionnaire (AUA-7): Over the past month.   1)  How often have you had a sensation of not emptying your bladder completely after you finish urinating?  2 - Less than half the time  2)  How often have you had to urinate again less than two hours after you finished urinating? 1 - Less than 1 time in 5  3)  How often have you found you stopped and started again several times when you urinated?  1 - Less than 1 time in 5  4) How difficult have you found it to postpone urination?  1 - Less than 1 time in 5  5) How often have you had a weak urinary stream?  0 - Not at all  6) How often have you had to push or strain to begin urination?  0 - Not at all  7) How many times did you most typically get up to urinate from the time you went to bed until the time you got up in the morning?  2 - 2 times  Total score:  0-7 mildly symptomatic   8-19 moderately symptomatic   20-35 severely symptomatic     Diet: he is not following any diet plan  Exercise: does not exercise  Last Dental Exam: overdue  Last Eye Exam: overdue  Alcohol: does not drink.   Depression: phq 9 is positive- He says that his girlfriend broke up with him just yesterday. Today he denies feelings of suicidal ideation, homicidal ideation, worthlessness, helplessness, hypersomnia, or illicit drug use.     11/29/2022    8:34 AM 04/07/2016    9:19 AM 04/07/2016    8:14 AM  Depression screen PHQ 2/9  Decreased Interest 1 1 1   Down, Depressed, Hopeless 2 1 1   PHQ - 2 Score 3 2 2   Altered sleeping 2 2   Tired, decreased energy 2 2   Change in appetite 1 3   Feeling bad or failure about yourself  2 2   Trouble concentrating 3 1   Moving  slowly or fidgety/restless 2 1   Suicidal thoughts 2 0   PHQ-9 Score 17 13   Difficult doing work/chores Somewhat difficult Somewhat difficult     Title   Diabetic Foot Exam - detailed Date & Time: 11/29/2022  9:56 AM Diabetic Foot exam was performed with the following findings: Yes  Is there a history of foot ulcer?: No Is there a foot ulcer now?: No Is there swelling?: No Is there elevated skin temperature?: No Is there abnormal foot shape?: No Is there a claw toe deformity?: No Are the toenails long?: No Are the toenails thick?: No Are the toenails ingrown?: No Is the skin thin, fragile, shiny and hairless?": No Normal Range of Motion?: Yes Is there foot or ankle muscle weakness?: No Do you have pain in calf while walking?: No Are the shoes appropriate in style and fit?: Yes Can the patient see the bottom of their feet?: No Pulse Foot Exam completed.: Yes   Right Dorsalis Pedis: Present Left Dorsalis Pedis: Present     Sensory Foot Exam Completed.: Yes Semmes-Weinstein  Monofilament Test "+" means "has sensation" and "-" means "no sensation"   R Site 1-Great Toe: Pos L Site 1-Great Toe: Pos   R Site 4: Pos L Site 4: Pos   R site 5: Pos L Site 5: Pos  R Site 6: Pos L Site 6: Pos     Image components are not supported.   Image components are not supported. Image components are not supported.  Tuning Fork Right vibratory: present Left vibratory: present  Comments      Hypertension:  BP Readings from Last 3 Encounters:  11/29/22 (!) 144/86  11/01/22 (!) 184/104  02/16/18 (!) 146/92    Obesity: Wt Readings from Last 3 Encounters:  11/29/22 272 lb 8 oz (123.6 kg)  11/01/22 275 lb 2 oz (124.8 kg)  02/16/18 292 lb 6.4 oz (132.6 kg)   BMI Readings from Last 3 Encounters:  11/29/22 38.55 kg/m  11/01/22 38.92 kg/m  02/16/18 41.36 kg/m     Lipids:  Lab Results  Component Value Date   CHOL 229 (H) 11/08/2022   CHOL 162 04/11/2016   Lab Results   Component Value Date   HDL 47 11/08/2022   HDL 46 04/11/2016   Lab Results  Component Value Date   LDLCALC 133 (H) 11/08/2022   LDLCALC 91 04/11/2016   Lab Results  Component Value Date   TRIG 273 (H) 11/08/2022   TRIG 124 04/11/2016   Lab Results  Component Value Date   CHOLHDL 3.5 04/11/2016   No results found for: "LDLDIRECT" Glucose:  Glucose  Date Value Ref Range Status  11/08/2022 227 (H) 70 - 99 mg/dL Final  16/11/9602 540 (H) 65 - 99 mg/dL Final   Glucose, Bld  Date Value Ref Range Status  01/29/2015 117 (H) 65 - 99 mg/dL Final  98/12/9145 829 (H) 65 - 99 mg/dL Final  56/21/3086 94 70 - 99 mg/dL Final     Widowed  STD testing and prevention (HIV/chl/gon/syphilis):  no  Sexual history: females   Hep C Screening: due  Skin cancer: Discussed monitoring for atypical lesions- hx of begning spot on his face. There is another area on the left side of his face.  Colorectal cancer: Last colonoscopy was 2012- he is over due   Prostate cancer:  no 04/11/2016  0.9- he does not have any concerns today.  No results found for: "PSA"   Lung cancer:  Low Dose CT Chest recommended if Age 73-80 years, 30 pack-year currently smoking OR have quit w/in 15years. Patient  no a candidate for screening   AAA: The USPSTF recommends one-time screening with ultrasonography in men ages 26 to 75 years who have ever smoked. Patient   not applicable, a candidate for screening  ECG:  n/a  Advanced Care Planning: A voluntary discussion about advance care planning including the explanation and discussion of advance directives.  Discussed health care proxy and Living will, and the patient was able to identify a health care proxy as his son Ramondo Dietze.  Patient does not have a living will and power of attorney of health care   Patient Active Problem List   Diagnosis Date Noted   DM (diabetes mellitus), type 2 (HCC) 11/09/2022   Encounter for annual physical exam 11/01/2022   Urinary  frequency 11/01/2022   Hypertension 04/07/2016   Depression, major, single episode, moderate (HCC) 04/07/2016   Morbid obesity (HCC) 04/07/2016   HNP (herniated nucleus pulposus), lumbar 07/04/2014    Past Surgical History:  Procedure Laterality  Date   BACK SURGERY      x2   CHOLECYSTECTOMY     CYSTOSCOPY     LUMBAR LAMINECTOMY/DECOMPRESSION MICRODISCECTOMY N/A 07/04/2014   Procedure:  Left lumbar three- lumbar four microdiscectomy;  Surgeon: Coletta Memos, MD;  Location: [facility];  Service: Neurosurgery;  Laterality: N/A;  L34 microdiskectomy   NASAL POLYP SURGERY     SHOULDER ARTHROSCOPY Right    right   VASECTOMY      Family History  Problem Relation Age of Onset   Cancer Father        colon   COPD Father     Social History   Socioeconomic History   Marital status: Widowed    Spouse name: Not on file   Number of children: Not on file   Years of education: Not on file   Highest education level: Not on file  Occupational History   Not on file  Tobacco Use   Smoking status: Former    Types: Cigarettes, Cigars    Start date: 7    Quit date: 2014    Years since quitting: 10.7   Smokeless tobacco: Never  Substance and Sexual Activity   Alcohol use: No    Alcohol/week: 0.0 standard drinks of alcohol    Comment: occ   Drug use: No   Sexual activity: Not Currently  Other Topics Concern   Not on file  Social History Narrative   Not on file   Social Determinants of Health   Financial Resource Strain: Not on file  Food Insecurity: Not on file  Transportation Needs: Not on file  Physical Activity: Not on file  Stress: Not on file  Social Connections: Not on file  Intimate Partner Violence: Not on file     Current Outpatient Medications:    Blood Pressure Monitoring (ADULT BLOOD PRESSURE CUFF LG) KIT, Check blood pressure twice a day, Disp: 1 kit, Rfl: 0   escitalopram (LEXAPRO) 10 MG tablet, Take one 10 mg tablet for 4 days, then take 2 tablets daily.,  Disp: 60 tablet, Rfl: 1   lisinopril (ZESTRIL) 20 MG tablet, Take 1 tablet (20 mg total) by mouth daily., Disp: 30 tablet, Rfl: 11   metFORMIN (GLUMETZA) 1000 MG (MOD) 24 hr tablet, Take 1 tablet (1,000 mg total) by mouth daily with breakfast., Disp: 90 tablet, Rfl: 3   atorvastatin (LIPITOR) 20 MG tablet, Take 1 tablet (20 mg total) by mouth at bedtime., Disp: 30 tablet, Rfl: 2  Allergies  Allergen Reactions   Hydrocodone Other (See Comments)    Pt states he took cough medicine with hydrocodone and it made him very dizzy and light headed.     Review of Systems  Constitutional: Negative.   HENT: Negative.    Eyes: Negative.   Respiratory: Negative.    Cardiovascular: Negative.   Gastrointestinal: Negative.   Genitourinary: Negative.   Musculoskeletal: Negative.   Skin: Negative.   Neurological: Negative.   Endo/Heme/Allergies: Negative.   Psychiatric/Behavioral:  Positive for depression.        Objective  Vitals:   11/29/22 0830 11/29/22 1030  BP: (!) 182/104 (!) 144/86  Pulse: 74   Resp: 18   Temp: (!) 97.5 F (36.4 C)   SpO2: 98%   Weight: 272 lb 8 oz (123.6 kg)   Height: 5' 10.5" (1.791 m)     Body mass index is 38.55 kg/m.  Physical Exam Vitals reviewed.  Constitutional:      Appearance: Normal appearance. He is  obese.  HENT:     Head: Normocephalic.     Right Ear: Tympanic membrane, ear canal and external ear normal.     Left Ear: Tympanic membrane, ear canal and external ear normal.     Nose: Nose normal.     Mouth/Throat:     Mouth: Mucous membranes are dry.  Eyes:     Extraocular Movements: Extraocular movements intact.     Conjunctiva/sclera: Conjunctivae normal.     Pupils: Pupils are equal, round, and reactive to light.  Cardiovascular:     Rate and Rhythm: Normal rate and regular rhythm.     Pulses: Normal pulses.     Heart sounds: Normal heart sounds.  Pulmonary:     Effort: Pulmonary effort is normal.     Breath sounds: Normal breath  sounds.  Abdominal:     General: There is no distension.     Palpations: Abdomen is soft. There is no mass.     Tenderness: There is abdominal tenderness. There is no right CVA tenderness, left CVA tenderness, guarding or rebound.     Hernia: No hernia is present.  Musculoskeletal:        General: Normal range of motion.     Cervical back: Normal range of motion and neck supple.  Skin:    General: Skin is warm and dry.     Capillary Refill: Capillary refill takes less than 2 seconds.  Neurological:     Mental Status: He is alert and oriented to person, place, and time. Mental status is at baseline.  Psychiatric:        Mood and Affect: Mood normal.        Behavior: Behavior normal.      Recent Results (from the past 2160 hour(s))  CBC     Status: Abnormal   Collection Time: 11/08/22  8:35 AM  Result Value Ref Range   WBC 11.1 (H) 3.4 - 10.8 x10E3/uL   RBC 4.72 4.14 - 5.80 x10E6/uL   Hemoglobin 14.2 13.0 - 17.7 g/dL   Hematocrit 10.2 72.5 - 51.0 %   MCV 93 79 - 97 fL   MCH 30.1 26.6 - 33.0 pg   MCHC 32.3 31.5 - 35.7 g/dL   RDW 36.6 44.0 - 34.7 %   Platelets 238 150 - 450 x10E3/uL  Hemoglobin A1c     Status: Abnormal   Collection Time: 11/08/22  8:35 AM  Result Value Ref Range   Hgb A1c MFr Bld 9.4 (H) 4.8 - 5.6 %    Comment:          Prediabetes: 5.7 - 6.4          Diabetes: >6.4          Glycemic control for adults with diabetes: <7.0    Est. average glucose Bld gHb Est-mCnc 223 mg/dL  Basic metabolic panel     Status: Abnormal   Collection Time: 11/08/22  8:35 AM  Result Value Ref Range   Glucose 227 (H) 70 - 99 mg/dL   BUN 15 8 - 27 mg/dL   Creatinine, Ser 4.25 0.76 - 1.27 mg/dL   eGFR 75 >95 GL/OVF/6.43   BUN/Creatinine Ratio 14 10 - 24   Sodium 138 134 - 144 mmol/L   Potassium 4.4 3.5 - 5.2 mmol/L   Chloride 102 96 - 106 mmol/L   CO2 21 20 - 29 mmol/L   Calcium 9.4 8.6 - 10.2 mg/dL  Lipid Panel w/o Chol/HDL Ratio     Status:  Abnormal   Collection Time:  11/08/22  8:35 AM  Result Value Ref Range   Cholesterol, Total 229 (H) 100 - 199 mg/dL   Triglycerides 401 (H) 0 - 149 mg/dL   HDL 47 >02 mg/dL   VLDL Cholesterol Cal 49 (H) 5 - 40 mg/dL   LDL Chol Calc (NIH) 725 (H) 0 - 99 mg/dL  TSH     Status: None   Collection Time: 11/08/22  8:35 AM  Result Value Ref Range   TSH 3.080 0.450 - 4.500 uIU/mL  Specimen status report     Status: None   Collection Time: 11/08/22  8:35 AM  Result Value Ref Range   specimen status report Comment     Comment: Written Authorization Written Authorization Written Authorization Received. Authorization received from Christianne Dolin 11-09-2022 Logged by Adria Dill      Fall Risk:    11/29/2022    8:38 AM  Fall Risk   Falls in the past year? 0     Functional Status Survey: Is the patient deaf or have difficulty hearing?: No Does the patient have difficulty seeing, even when wearing glasses/contacts?: Yes Does the patient have difficulty concentrating, remembering, or making decisions?: Yes Does the patient have difficulty walking or climbing stairs?: No Does the patient have difficulty dressing or bathing?: No Does the patient have difficulty doing errands alone such as visiting a doctor's office or shopping?: No    Assessment & Plan  Primary hypertension Assessment & Plan: His initial bp was 182/104, at recheck 162/80. He received 0.1 mg clonidine in clinic. I am increasing his lisinopril to 20 mg and I have ordered a blood pressure cuff. I hope that his pharmacy will fill this. He should be checking his blood pressure twice a day, I have included instructions for the DASH diet as well.    Last bp reading 146/82.   Orders: -     Comprehensive metabolic panel -     CBC with Differential/Platelet -     Lisinopril; Take 1 tablet (20 mg total) by mouth daily.  Dispense: 30 tablet; Refill: 11 -     Adult Blood Pressure Cuff Lg; Check blood pressure twice a day  Dispense: 1 kit; Refill: 0 -      cloNIDine HCl  Type 2 diabetes mellitus with hyperglycemia, without long-term current use of insulin (HCC) Assessment & Plan: He has tolerated Glucophage 500 mg once daily, for tighter control I am increasing this to 1000 mg daily. Needs a diabetic eye exam and or to see an optometrist. Labs collected today for microalbumin/creatinine ratio and hgba1c.   Orders: -     Hemoglobin A1c -     Microalbumin / creatinine urine ratio -     metFORMIN HCl ER (MOD); Take 1 tablet (1,000 mg total) by mouth daily with breakfast.  Dispense: 90 tablet; Refill: 3  Depression, major, single episode, moderate (HCC) Assessment & Plan: Depression screening was 17 today. He was using prozac back in 2018 but says it didn't do much for him. I will start him on Lexapro 10 mg for 4 days increasing to 20 mg daily. He will follow up in 3 months.   Orders: -     VITAMIN D 25 Hydroxy (Vit-D Deficiency, Fractures) -     Escitalopram Oxalate; Take one 10 mg tablet for 4 days, then take 2 tablets daily.  Dispense: 60 tablet; Refill: 1  Morbid obesity (HCC) Assessment & Plan: Discussed diet and exercise and the  benefits. He needs to monitor his diet and increase activity.   Orders: -     Hemoglobin A1c  Encounter for annual physical exam Assessment & Plan: Screening labs completed. The patient needs colonoscopy, referral sent to GI.   -Prostate cancer screening and PSA options (with potential risks and benefits of testing vs not testing) were discussed along with recent recs/guidelines. -USPSTF grade A and B recommendations reviewed with patient; age-appropriate recommendations, preventive care, screening tests, etc discussed and encouraged; healthy living encouraged; see AVS for patient education given to patient -Discussed importance of 150 minutes of physical activity weekly, eat two servings of fish weekly, eat one serving of tree nuts ( cashews, pistachios, pecans, almonds.Marland Kitchen) every other day, eat 6 servings  of fruit/vegetables daily and drink plenty of water and avoid sweet beverages.  -Reviewed Health Maintenance: yes  Orders: -     Hepatitis C antibody -     HIV Antibody (routine testing w rflx)  Screening for colon cancer -     Ambulatory referral to Gastroenterology

## 2022-11-29 NOTE — Assessment & Plan Note (Addendum)
His initial bp was 182/104, at recheck 162/80. He received 0.1 mg clonidine in clinic. I am increasing his lisinopril to 20 mg and I have ordered a blood pressure cuff. I hope that his pharmacy will fill this. He should be checking his blood pressure twice a day, I have included instructions for the DASH diet as well.    Last bp reading 146/82.

## 2022-11-29 NOTE — Patient Instructions (Addendum)
[facility] has shingles vaccinations- please inquire about this.   I am increasing your blood pressure medication Lisinopril to 20 mgs. Take BP medication in the mornings (you can take 2 10 mg tablets until finished) .   Metformin- take in the morning with breakfast   Atorvastatin (cholesterol med) take at bedtime   Escitalopram is the antidepressant medication. Start by taking 10 mg once daily for 4 days, then increase to 20 mg once daily. We will follow up on this in 3 months.   You should have a dental and eye exam yearly. Our office will have diabetic eye exams next year. In the mean time call and schedule an appointment with opthalmology.

## 2022-11-29 NOTE — Assessment & Plan Note (Signed)
Screening labs completed. The patient needs colonoscopy, referral sent to GI.   -Prostate cancer screening and PSA options (with potential risks and benefits of testing vs not testing) were discussed along with recent recs/guidelines. -USPSTF grade A and B recommendations reviewed with patient; age-appropriate recommendations, preventive care, screening tests, etc discussed and encouraged; healthy living encouraged; see AVS for patient education given to patient -Discussed importance of 150 minutes of physical activity weekly, eat two servings of fish weekly, eat one serving of tree nuts ( cashews, pistachios, pecans, almonds.Marland Kitchen) every other day, eat 6 servings of fruit/vegetables daily and drink plenty of water and avoid sweet beverages.  -Reviewed Health Maintenance: yes

## 2022-11-30 LAB — CBC WITH DIFFERENTIAL/PLATELET
Basophils Absolute: 0 10*3/uL (ref 0.0–0.2)
Basos: 0 %
EOS (ABSOLUTE): 0.3 10*3/uL (ref 0.0–0.4)
Eos: 3 %
Hematocrit: 40.1 % (ref 37.5–51.0)
Hemoglobin: 12.9 g/dL — ABNORMAL LOW (ref 13.0–17.7)
Immature Grans (Abs): 0 10*3/uL (ref 0.0–0.1)
Immature Granulocytes: 0 %
Lymphocytes Absolute: 2.3 10*3/uL (ref 0.7–3.1)
Lymphs: 22 %
MCH: 29.9 pg (ref 26.6–33.0)
MCHC: 32.2 g/dL (ref 31.5–35.7)
MCV: 93 fL (ref 79–97)
Monocytes Absolute: 0.6 10*3/uL (ref 0.1–0.9)
Monocytes: 6 %
Neutrophils Absolute: 7.5 10*3/uL — ABNORMAL HIGH (ref 1.4–7.0)
Neutrophils: 69 %
Platelets: 265 10*3/uL (ref 150–450)
RBC: 4.32 x10E6/uL (ref 4.14–5.80)
RDW: 12.6 % (ref 11.6–15.4)
WBC: 10.8 10*3/uL (ref 3.4–10.8)

## 2022-11-30 LAB — COMPREHENSIVE METABOLIC PANEL
ALT: 56 [IU]/L — ABNORMAL HIGH (ref 0–44)
AST: 58 [IU]/L — ABNORMAL HIGH (ref 0–40)
Albumin: 4.3 g/dL (ref 3.9–4.9)
Alkaline Phosphatase: 67 [IU]/L (ref 44–121)
BUN/Creatinine Ratio: 15 (ref 10–24)
BUN: 19 mg/dL (ref 8–27)
Bilirubin Total: 0.5 mg/dL (ref 0.0–1.2)
CO2: 20 mmol/L (ref 20–29)
Calcium: 9.9 mg/dL (ref 8.6–10.2)
Chloride: 106 mmol/L (ref 96–106)
Creatinine, Ser: 1.29 mg/dL — ABNORMAL HIGH (ref 0.76–1.27)
Globulin, Total: 3 g/dL (ref 1.5–4.5)
Glucose: 280 mg/dL — ABNORMAL HIGH (ref 70–99)
Potassium: 4.6 mmol/L (ref 3.5–5.2)
Sodium: 143 mmol/L (ref 134–144)
Total Protein: 7.3 g/dL (ref 6.0–8.5)
eGFR: 62 mL/min/{1.73_m2} (ref >59)

## 2022-11-30 LAB — MICROALBUMIN / CREATININE URINE RATIO
Creatinine, Urine: 155.9 mg/dL
Microalb/Creat Ratio: 2492 mg/g{creat} — ABNORMAL HIGH (ref 0–29)
Microalbumin, Urine: 3884.4 ug/mL

## 2022-11-30 LAB — HEMOGLOBIN A1C
Est. average glucose Bld gHb Est-mCnc: 223 mg/dL
Hgb A1c MFr Bld: 9.4 % — ABNORMAL HIGH (ref 4.8–5.6)

## 2022-11-30 LAB — VITAMIN D 25 HYDROXY (VIT D DEFICIENCY, FRACTURES): Vit D, 25-Hydroxy: 22.4 ng/mL — ABNORMAL LOW (ref 30.0–100.0)

## 2022-11-30 LAB — HEPATITIS C ANTIBODY: Hep C Virus Ab: NONREACTIVE

## 2022-11-30 LAB — HIV ANTIBODY (ROUTINE TESTING W REFLEX): HIV Screen 4th Generation wRfx: NONREACTIVE

## 2022-12-01 NOTE — Progress Notes (Signed)
I have reviewed the results of your recent test and lab work that you underwent at our clinic. Findings indicate negative HIV and Hep C.   Based on the test results, I would like to recommend the following course of action to address any health concern:  Your creatinine and liver function is mildly elevated, you should drink plenty of fluids and decrease alcohol consumption if you're drinking. Vitamin D is a little low, taking a mens one-a day multivitamin could help increase this value.   Diabetes- the hgb A1c is the same at 9.4. continue with plan to increase metformin along with dietary monitoring. We can add an injectable to help decrease your risk for heart disease and stroke. It can also help with weight reduction. We can discuss this at your next visit in 3 months.

## 2022-12-02 NOTE — Progress Notes (Signed)
Leak, Luetta Nutting, NP  Christianne Dolin, CMA I have reviewed the results of your recent test and lab work that you underwent at our clinic. Findings indicate negative HIV and Hep C.  Based on the test results, I would like to recommend the following course of action to address any health concern:  Your creatinine and liver function is mildly elevated, you should drink plenty of fluids and decrease alcohol consumption if you're drinking. Vitamin D is a little low, taking a mens one-a day multivitamin could help increase this value.  Diabetes- the hgb A1c is the same at 9.4. continue with plan to increase metformin along with dietary monitoring. We can add an injectable to help decrease your risk for heart disease and stroke. It can also help with weight reduction. We can discuss this at your next visit in 3 months.  Patient informed results and recommendations.

## 2022-12-03 LAB — LIPID PANEL W/O CHOL/HDL RATIO
Cholesterol, Total: 133 mg/dL (ref 100–199)
HDL: 37 mg/dL — ABNORMAL LOW (ref >39)
LDL Chol Calc (NIH): 61 mg/dL (ref 0–99)
Triglycerides: 211 mg/dL — ABNORMAL HIGH (ref 0–149)
VLDL Cholesterol Cal: 35 mg/dL (ref 5–40)

## 2022-12-03 LAB — SPECIMEN STATUS REPORT

## 2022-12-06 NOTE — Progress Notes (Signed)
Daniel Conner, Daniel Nutting, NP  Christianne Dolin, CMA Lipid panel is back and it looks much improved from the first one 4 weeks ago. Continue atorvastatin 20 mg along with decreasing fried food intake.  Patient informed of results and provider recommendations

## 2023-01-03 ENCOUNTER — Other Ambulatory Visit: Payer: Self-pay

## 2023-01-03 DIAGNOSIS — I10 Essential (primary) hypertension: Secondary | ICD-10-CM

## 2023-01-03 MED ORDER — METFORMIN HCL ER 500 MG PO TB24: 500.0000 mg | ORAL_TABLET | Freq: Two times a day (BID) | ORAL | 3 refills | Status: DC

## 2023-01-25 ENCOUNTER — Other Ambulatory Visit: Payer: Self-pay | Admitting: Student

## 2023-01-25 DIAGNOSIS — F321 Major depressive disorder, single episode, moderate: Secondary | ICD-10-CM

## 2023-02-05 ENCOUNTER — Other Ambulatory Visit: Payer: Self-pay | Admitting: Student

## 2023-02-28 ENCOUNTER — Encounter: Payer: Self-pay | Admitting: Student

## 2023-02-28 ENCOUNTER — Ambulatory Visit: Payer: No Typology Code available for payment source | Admitting: Student

## 2023-02-28 VITALS — BP 148/78 | HR 82 | Temp 98.5°F | Resp 16 | Ht 71.0 in | Wt 263.6 lb

## 2023-02-28 DIAGNOSIS — I10 Essential (primary) hypertension: Secondary | ICD-10-CM | POA: Diagnosis not present

## 2023-02-28 DIAGNOSIS — E1165 Type 2 diabetes mellitus with hyperglycemia: Secondary | ICD-10-CM | POA: Diagnosis not present

## 2023-02-28 DIAGNOSIS — F321 Major depressive disorder, single episode, moderate: Secondary | ICD-10-CM

## 2023-02-28 MED ORDER — BUPROPION HCL ER (XL) 150 MG PO TB24: 150.0000 mg | ORAL_TABLET | Freq: Every day | ORAL | 0 refills | Status: DC

## 2023-02-28 MED ORDER — SEMAGLUTIDE(0.25 OR 0.5MG/DOS) 2 MG/3ML ~~LOC~~ SOPN: 0.5000 mg | PEN_INJECTOR | SUBCUTANEOUS | 1 refills | Status: AC

## 2023-02-28 MED ORDER — AMLODIPINE BESYLATE 5 MG PO TABS: 5.0000 mg | ORAL_TABLET | Freq: Every day | ORAL | 11 refills | Status: DC

## 2023-02-28 MED ORDER — OZEMPIC (0.25 OR 0.5 MG/DOSE) 2 MG/1.5ML ~~LOC~~ SOPN: 0.2500 mg | PEN_INJECTOR | SUBCUTANEOUS | 0 refills | Status: AC

## 2023-02-28 MED ORDER — OZEMPIC (2 MG/DOSE) 8 MG/3ML ~~LOC~~ SOPN: 2.0000 mg | PEN_INJECTOR | SUBCUTANEOUS | 0 refills | Status: DC

## 2023-02-28 NOTE — Assessment & Plan Note (Signed)
 He is doing well on Glucophage.  I am adding semaglutide once weekly injectables for management of diabetes.  He is not fasting today.  Lab work to be collected next week at nurse visit. Sample of semaglutide dispensed along with instructions for use.

## 2023-02-28 NOTE — Patient Instructions (Addendum)
 Current does escitalopram  20 mg- DAY 1-3 take 1/2 tablet, and start bupropion  . Day 4 STOP escitalopram   continue 150 mg.  Add amlodipine  5 mg once a day to your lisinopril  continue checking blood pressure.   Search for a therapist in your area. Also try to get in with an optometrist for retinal exam.    Return in 1 week nurse visit for labs and blood pressure check.

## 2023-02-28 NOTE — Assessment & Plan Note (Signed)
 He is still not well controlled Bps have been 140/150s . We will add amlodipine 5 mg once daily to lisinopril 20 mg and follow up in 2 weeks.

## 2023-02-28 NOTE — Progress Notes (Signed)
 Established Patient Office Visit  Subjective   Patient ID: Daniel Conner, male    DOB: 12/02/1960  Age: 63 y.o. MRN: [id number]  Chief Complaint  Patient presents with   Follow-up    HPI  Daniel Conner is a 63 year old male who presents for 3 month follow up:   Hypertension: The last time I saw him his blood pressures where elevated and he had not been checking at home and BP were 180s , dose adjustments on lisinopril  to 20 mg. Today he says that he has been checking his blood pressure with readings similar today, Bps have been 140/150s at home. He says that he is watching sodium intake and more active. He denies shortness of breath, chest pain, leg swelling or dizziness.   DM type 2: He is not fasting today and does not normally collect glucose at home. He has tolerated the increase Glucophage  1000 mg once daily and denies polyuria, polydipsia, or polyphagia. He is not fasting today. His last A1c was 9.4 in October. He says that he has cut out sodas, eating in moderation, cutting back on carbohydrates and is walking more frequently.  We previously discussed GLP-1 but this was not initiated at that time.  The patient also needs to see an optometrist for diabetic retinopathy evaluation.     Depression: We initiated escitalopram   20 mg for depression 3 months ago, today he states "I don't really feel a change, I have thoughts of suicide at times but I wont do that, I have grand children that I want to see grow up."  He also notes feelings of f guilt and that he is screwing up things. He has not been to a therapist yet but is in a new relationship where he feels comfortable discussing things with. He notes sexual dysfunction with escitalopram , similar to the Prozac  years ago. He is interested in changing antidepressant medications.  He denies panic attacks, loss of interest, weight loss, or isolation.  He emphatically states that he does not have a plan to hurt himself or anyone else.  Review  of Systems  Constitutional: Negative.   HENT: Negative.    Eyes: Negative.   Respiratory: Negative.    Cardiovascular: Negative.   Gastrointestinal: Negative.   Genitourinary: Negative.   Musculoskeletal: Negative.   Skin: Negative.   Neurological: Negative.   Psychiatric/Behavioral:  Positive for depression.       Objective:     BP (!) 148/78 (BP Location: Left Arm, Cuff Size: Large)   Pulse 82   Temp 98.5 F (36.9 C) (Oral)   Resp 16   Ht 5\' 11"  (1.803 m)   Wt 263 lb 9.6 oz (119.6 kg)   SpO2 99%   BMI 36.76 kg/m    Physical Exam Vitals reviewed.  Constitutional:      Appearance: Normal appearance. He is obese.  Cardiovascular:     Rate and Rhythm: Normal rate and regular rhythm.     Pulses: Normal pulses.     Heart sounds: Normal heart sounds. No murmur heard. Pulmonary:     Effort: Pulmonary effort is normal.     Breath sounds: Normal breath sounds.  Abdominal:     General: Bowel sounds are normal. There is no distension.     Palpations: Abdomen is soft.     Tenderness: There is no abdominal tenderness. There is no right CVA tenderness or left CVA tenderness.  Musculoskeletal:        General: Normal range  of motion.     Right lower leg: No edema.     Left lower leg: No edema.  Skin:    General: Skin is warm and dry.     Capillary Refill: Capillary refill takes less than 2 seconds.  Neurological:     General: No focal deficit present.     Mental Status: He is alert and oriented to person, place, and time.  Psychiatric:        Mood and Affect: Mood normal.        Behavior: Behavior normal.      No results found for any visits on 02/28/23.    The 10-year ASCVD risk score (Arnett DK, et al., 2019) is: 27.8%    Assessment & Plan:   Problem List Items Addressed This Visit     Hypertension - Primary   He is still not well controlled Bps have been 140/150s . We will add amlodipine  5 mg once daily to lisinopril  20 mg and follow up in 2 weeks.        Relevant Medications   amLODipine  (NORVASC ) 5 MG tablet   Depression, major, single episode, moderate (HCC)   We will taper escitalopram  to make switch to bupropion  150 mg once daily.    Current does escitalopram  20 mg- DAY 1-3 take 1/2 tablet, and start bupropion  . Day 4 STOP escitalopram   continue 150 mg.        Relevant Medications   buPROPion  (WELLBUTRIN  XL) 150 MG 24 hr tablet   DM (diabetes mellitus), type 2 (HCC)   He is doing well on Glucophage .  I am adding semaglutide  once weekly injectables for management of diabetes.  He is not fasting today.  Lab work to be collected next week at nurse visit. Sample of semaglutide  dispensed along with instructions for use.       Relevant Medications   Semaglutide ,0.25 or 0.5MG /DOS, (OZEMPIC , 0.25 OR 0.5 MG/DOSE,) 2 MG/1.5ML SOPN   Semaglutide ,0.25 or 0.5MG /DOS, 2 MG/3ML SOPN (Start on 03/28/2023)   Semaglutide , 2 MG/DOSE, (OZEMPIC , 2 MG/DOSE,) 8 MG/3ML SOPN (Start on 04/25/2023)    Return in about 1 week (around 03/07/2023) for 1 week fasting NV for blood work (hem A1c, lipids, and cmp), 3 months from then follow up.. .    Nasario Badder, NP

## 2023-02-28 NOTE — Assessment & Plan Note (Signed)
 We will taper escitalopram to make switch to bupropion 150 mg once daily.    Current does escitalopram 20 mg- DAY 1-3 take 1/2 tablet, and start bupropion . Day 4 STOP escitalopram  continue 150 mg.

## 2023-03-07 ENCOUNTER — Other Ambulatory Visit: Payer: Self-pay

## 2023-03-07 ENCOUNTER — Ambulatory Visit: Payer: No Typology Code available for payment source

## 2023-03-07 DIAGNOSIS — E1165 Type 2 diabetes mellitus with hyperglycemia: Secondary | ICD-10-CM

## 2023-03-07 DIAGNOSIS — E7849 Other hyperlipidemia: Secondary | ICD-10-CM

## 2023-03-07 MED ORDER — ATORVASTATIN CALCIUM 20 MG PO TABS: 20.0000 mg | ORAL_TABLET | Freq: Every day | ORAL | 0 refills | Status: DC

## 2023-03-07 NOTE — Progress Notes (Signed)
 Nurse visit for bloodwork

## 2023-03-08 LAB — CMP14 + ANION GAP
ALT: 24 [IU]/L (ref 0–44)
AST: 18 [IU]/L (ref 0–40)
Albumin: 4.4 g/dL (ref 3.9–4.9)
Alkaline Phosphatase: 73 [IU]/L (ref 44–121)
Anion Gap: 16 mmol/L (ref 10.0–18.0)
BUN/Creatinine Ratio: 14 (ref 10–24)
BUN: 28 mg/dL — ABNORMAL HIGH (ref 8–27)
Bilirubin Total: 0.2 mg/dL (ref 0.0–1.2)
CO2: 17 mmol/L — ABNORMAL LOW (ref 20–29)
Calcium: 9.8 mg/dL (ref 8.6–10.2)
Chloride: 109 mmol/L — ABNORMAL HIGH (ref 96–106)
Creatinine, Ser: 2.07 mg/dL — ABNORMAL HIGH (ref 0.76–1.27)
Globulin, Total: 2.9 g/dL (ref 1.5–4.5)
Glucose: 200 mg/dL — ABNORMAL HIGH (ref 70–99)
Potassium: 5.3 mmol/L — ABNORMAL HIGH (ref 3.5–5.2)
Sodium: 142 mmol/L (ref 134–144)
Total Protein: 7.3 g/dL (ref 6.0–8.5)
eGFR: 35 mL/min/{1.73_m2} — ABNORMAL LOW (ref >59)

## 2023-03-08 LAB — LIPID PANEL
Chol/HDL Ratio: 4.8 {ratio} (ref 0.0–5.0)
Cholesterol, Total: 162 mg/dL (ref 100–199)
HDL: 34 mg/dL — ABNORMAL LOW (ref >39)
LDL Chol Calc (NIH): 91 mg/dL (ref 0–99)
Triglycerides: 218 mg/dL — ABNORMAL HIGH (ref 0–149)
VLDL Cholesterol Cal: 37 mg/dL (ref 5–40)

## 2023-03-08 LAB — HEMOGLOBIN A1C
Est. average glucose Bld gHb Est-mCnc: 189 mg/dL
Hgb A1c MFr Bld: 8.2 % — ABNORMAL HIGH (ref 4.8–5.6)

## 2023-03-09 DIAGNOSIS — E119 Type 2 diabetes mellitus without complications: Secondary | ICD-10-CM

## 2023-03-09 DIAGNOSIS — I10 Essential (primary) hypertension: Secondary | ICD-10-CM

## 2023-03-10 NOTE — Progress Notes (Signed)
Leak, Luetta Nutting, NP  Christianne Dolin, CMA I have reviewed your lab results. Cholesterol levels are stable and showing improvement. Continue to monitor your intake of greasy fried foods to lower the triglycerides. Your A1c is 8.2 which is improved from 9.4. We should increase the metformin to 1000 mg daily (sent to pharmacy) adding semaglutide as planned will likely decrease your levels as well. Your kidney function is showing a decline, this often a risk factor of uncontrolled diabetes. Before intervening on these levels, we need to collect a microalbumin/creatinine to evaluate any damage. (Please set up NV).  Pt informed of results and recommendations. He will come in to give urine sample

## 2023-03-10 NOTE — Progress Notes (Signed)
Daniel Conner, Luetta Nutting, NP  Christianne Dolin, CMA I have reviewed your lab results. Cholesterol levels are stable and showing improvement. Continue to monitor your intake of greasy fried foods to lower the triglycerides. Your A1c is 8.2 which is improved from 9.4. We should increase the metformin to 1000 mg daily (sent to pharmacy) adding semaglutide as planned will likely decrease your levels as well. Your kidney function is showing a decline, this often a risk factor of uncontrolled diabetes. Before intervening on these levels, we need to collect a microalbumin/creatinine to evaluate any damage. (Please set up NV).  Lmom for patient to call back for results and recommendations

## 2023-03-10 NOTE — Progress Notes (Signed)
I have reviewed your lab results.  Cholesterol levels are stable and showing improvement. Continue to monitor your intake of greasy fried foods to lower the triglycerides.  Your A1c is 8.2 which is improved from 9.4. We should increase the metformin to 1000 mg daily (sent to pharmacy) adding semaglutide as planned will likely decrease your levels as well.  Your kidney function is showing a decline, this often a risk factor of uncontrolled diabetes. Before intervening on these levels, we need to collect a microalbumin/creatinine to evaluate any damage. (Please set up NV).

## 2023-03-15 ENCOUNTER — Ambulatory Visit: Payer: No Typology Code available for payment source

## 2023-03-15 DIAGNOSIS — E1165 Type 2 diabetes mellitus with hyperglycemia: Secondary | ICD-10-CM

## 2023-03-15 NOTE — Progress Notes (Signed)
Nurse visit for Urine collection only

## 2023-03-16 LAB — MICROALBUMIN / CREATININE URINE RATIO
Creatinine, Urine: 132.4 mg/dL
Microalb/Creat Ratio: 617 mg/g{creat} — ABNORMAL HIGH (ref 0–29)
Microalbumin, Urine: 816.5 ug/mL

## 2023-03-20 ENCOUNTER — Encounter: Payer: Self-pay | Admitting: Student

## 2023-03-20 ENCOUNTER — Other Ambulatory Visit: Payer: Self-pay | Admitting: Student

## 2023-03-20 DIAGNOSIS — E1129 Type 2 diabetes mellitus with other diabetic kidney complication: Secondary | ICD-10-CM

## 2023-03-20 DIAGNOSIS — R809 Proteinuria, unspecified: Secondary | ICD-10-CM

## 2023-03-20 MED ORDER — DAPAGLIFLOZIN PROPANEDIOL 5 MG PO TABS: 5.0000 mg | ORAL_TABLET | Freq: Every day | ORAL | Status: DC

## 2023-03-20 MED ORDER — DAPAGLIFLOZIN PROPANEDIOL 5 MG PO TABS: 5.0000 mg | ORAL_TABLET | Freq: Every day | ORAL | 2 refills | Status: DC

## 2023-03-20 NOTE — Progress Notes (Signed)
Your recent labs show that your diabetes has caused some damage to your kidneys. We should initiate a medication called Farxiga at 5 mg once daily in the morning to help with kidney damage. Please increase your fluid intake!   Schedule a f/u in 1 month to re-evaluate with fasting labs.

## 2023-03-20 NOTE — Progress Notes (Signed)
Daniel Conner, Luetta Nutting, NP  Christianne Dolin, CMA Your recent labs show that your diabetes has caused some damage to your kidneys. We should initiate a medication called Farxiga at 5 mg once daily in the morning to help with kidney damage. Please increase your fluid intake!  Schedule a f/u in 1 month to re-evaluate with fasting labs.  Patient informed, He will schedule appt. In 1 month to re-evaluate and have fasting bw. He will pick up sample of Farxiga 03/21/23 and paper to show [employer] approved Ozempic and gave a P.A. on 03/02/23.  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

## 2023-04-05 ENCOUNTER — Other Ambulatory Visit: Payer: Self-pay | Admitting: Student

## 2023-04-18 ENCOUNTER — Ambulatory Visit: Payer: No Typology Code available for payment source | Admitting: Student

## 2023-04-18 ENCOUNTER — Encounter: Payer: Self-pay | Admitting: Student

## 2023-04-18 VITALS — BP 134/78 | HR 77 | Temp 97.2°F | Resp 18 | Ht 70.5 in | Wt 256.0 lb

## 2023-04-18 DIAGNOSIS — F321 Major depressive disorder, single episode, moderate: Secondary | ICD-10-CM | POA: Diagnosis not present

## 2023-04-18 DIAGNOSIS — E1165 Type 2 diabetes mellitus with hyperglycemia: Secondary | ICD-10-CM

## 2023-04-18 DIAGNOSIS — E1122 Type 2 diabetes mellitus with diabetic chronic kidney disease: Secondary | ICD-10-CM | POA: Diagnosis not present

## 2023-04-18 DIAGNOSIS — N1831 Type 2 diabetes mellitus with diabetic chronic kidney disease: Secondary | ICD-10-CM

## 2023-04-18 MED ORDER — SEMAGLUTIDE (1 MG/DOSE) 4 MG/3ML ~~LOC~~ SOPN: 1.0000 mg | PEN_INJECTOR | SUBCUTANEOUS | 2 refills | Status: DC

## 2023-04-18 MED ORDER — DAPAGLIFLOZIN PROPANEDIOL 10 MG PO TABS: 10.0000 mg | ORAL_TABLET | Freq: Every day | ORAL | 2 refills | Status: DC

## 2023-04-18 NOTE — Patient Instructions (Signed)
 Finish Ozempic 0.25 mg dose then increase 0.5 mg.

## 2023-04-18 NOTE — Progress Notes (Signed)
 Established Patient Office Visit  Subjective   Patient ID: Daniel Conner, male    DOB: 07-13-1960  Age: 63 y.o. MRN: [id number]  Chief Complaint  Patient presents with   Follow-up    1 month follow up    HPI  Daniel Conner is a 63 year old male returns for 1 month follow up for T2DM and depression.   DM type 2: He is not fasting today and does not normally collect glucose at home. He has tolerated the increase Glucophage 1000 mg once daily and denies polyuria, polydipsia, or polyphagia. He is not fasting today. His last A1c was 9.4 in October. He says that he has cut out sodas, eating in moderation, cutting back on carbohydrates and is walking more frequently.  We previously discussed GLP-1 but this was not initiated at that time.  The patient also needs to see an optometrist for diabetic retinopathy evaluation.     Depression: We initiated escitalopram  20 mg for depression 3 months ago, today he states "I don't really feel a change, I have thoughts of suicide at times but I wont do that, I have grand children that I want to see grow up."  He also notes feelings of guilt and that he is screwing up things. He has not been to a therapist yet but is in a new relationship where he feels comfortable discussing things with. He notes sexual dysfunction with escitalopram, similar to the Prozac years ago. He is interested in changing antidepressant medications.  He denies panic attacks, loss of interest, weight loss, or isolation.  He emphatically states that he does not have a plan to hurt himself or anyone else.   Daniel Conner returns for follow up of T2DM, initial A1C was 9.4 with FBS in 280's.  We started him on metformin 500 mg BID and semaglutide once weekly sq injections.  When I saw him last month his A1C was 8.2 with a micro/cret of 617 this is down from 4 months ago where he was 2,492 . At home he is checking FBS, they have run 150-160 and have been trending down since he began. He has been  tolerating metformin 1000 mg and semaglutide subcute 0.25 mg without issue.  His kidney function showed decline, eGFR 1 month ago was 35, this was down from 4 months prior where eGFR was 62.  Due to insurance issue the patient was unable to afford costs of semaglutide and Daniel Conner so samples were provided in office. He is still working with [name].  Today the patient denies documented hypoglycemia, nausea or vomiting, constipation or heart palpitations.  They also deny any long term complications of diabetic neuropathy, nephropathy, retinopathy or cardiovascular disease.    Daniel Conner returns to follow up of depression, at our last visit we tapered him off escitalopram and initiated bupropion 150 mg once. He says he is doing good on this and his mood has changed and he is able to enjoy being around his grand children and family.  I wanted him to look into therapy options, he is seeing a therapist with [facility] every 2 months and says this is going quite well. Today he denies feelings of suicidal ideation, homicidal ideation, worthlessness, helplessness, hypersomnia, denies difficulty concentrating, and no difficulty performing routine daily activities.    Review of Systems  All other systems reviewed and are negative.     Objective:     BP 134/78 (BP Location: Left Arm, Patient Position: Sitting, Cuff  Size: Normal)   Pulse 77   Temp (!) 97.2 F (36.2 C)   Resp 18   Ht 5' 10.5" (1.791 m)   Wt 256 lb (116.1 kg)   SpO2 96%   BMI 36.21 kg/m    Physical Exam Vitals reviewed.  Constitutional:      Appearance: Normal appearance. He is obese.  Cardiovascular:     Rate and Rhythm: Normal rate and regular rhythm.     Pulses: Normal pulses.     Heart sounds: Normal heart sounds.  Pulmonary:     Effort: Pulmonary effort is normal.     Breath sounds: Normal breath sounds.  Abdominal:     General: Bowel sounds are normal. There is no distension.     Palpations: Abdomen is soft.   Musculoskeletal:        General: Normal range of motion.  Skin:    General: Skin is warm and dry.     Capillary Refill: Capillary refill takes less than 2 seconds.  Neurological:     General: No focal deficit present.     Mental Status: He is alert and oriented to person, place, and time.  Psychiatric:        Behavior: Behavior normal.      No results found for any visits on 04/18/23.  Last CBC Lab Results  Component Value Date   WBC 10.8 11/29/2022   HGB 12.9 (L) 11/29/2022   HCT 40.1 11/29/2022   MCV 93 11/29/2022   MCH 29.9 11/29/2022   RDW 12.6 11/29/2022   PLT 265 11/29/2022   Last metabolic panel Lab Results  Component Value Date   GLUCOSE 200 (H) 03/07/2023   NA 142 03/07/2023   K 5.3 (H) 03/07/2023   CL 109 (H) 03/07/2023   CO2 17 (L) 03/07/2023   BUN 28 (H) 03/07/2023   CREATININE 2.07 (H) 03/07/2023   EGFR 35 (L) 03/07/2023   CALCIUM 9.8 03/07/2023   PROT 7.3 03/07/2023   ALBUMIN 4.4 03/07/2023   LABGLOB 2.9 03/07/2023   BILITOT 0.2 03/07/2023   ALKPHOS 73 03/07/2023   AST 18 03/07/2023   ALT 24 03/07/2023   ANIONGAP 9 01/29/2015   Last lipids Lab Results  Component Value Date   CHOL 162 03/07/2023   HDL 34 (L) 03/07/2023   LDLCALC 91 03/07/2023   TRIG 218 (H) 03/07/2023   CHOLHDL 4.8 03/07/2023   Last hemoglobin A1c Lab Results  Component Value Date   HGBA1C 8.2 (H) 03/07/2023      The 10-year ASCVD risk score (Arnett DK, et al., 2019) is: 28.5%    Assessment & Plan:   Problem List Items Addressed This Visit     Depression, major, single episode, moderate (HCC)   He is doing much better on Wellbutrin 150 mg, no changes made today. He will continue seeing his therapist.       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 He has been monitoring his diet and compliant with the new medications.  I will see him again for a 3 month  follow up next month. At that time we will check labs.   Samples for semaglutide and farxiga 10 mg provided. We had a productive discussion about renal health and diabetes.       Other Visit Diagnoses       Type 2 diabetes mellitus with hyperglycemia, without long-term current  use of insulin (HCC)           No follow-ups on file.    Edwena Blow, NP

## 2023-04-18 NOTE — Assessment & Plan Note (Signed)
 His A1C and Fasting blood sugars are trending down, this is really good news for him. He has been monitoring his diet and compliant with the new medications.  I will see him again for a 3 month follow up next month. At that time we will check labs.   Samples for semaglutide and farxiga 10 mg provided. We had a productive discussion about renal health and diabetes.

## 2023-04-18 NOTE — Assessment & Plan Note (Signed)
 He is doing much better on Wellbutrin 150 mg, no changes made today. He will continue seeing his therapist.

## 2023-04-19 DIAGNOSIS — I10 Essential (primary) hypertension: Secondary | ICD-10-CM | POA: Diagnosis not present

## 2023-04-19 DIAGNOSIS — E119 Type 2 diabetes mellitus without complications: Secondary | ICD-10-CM | POA: Diagnosis not present

## 2023-05-20 DIAGNOSIS — E119 Type 2 diabetes mellitus without complications: Secondary | ICD-10-CM

## 2023-05-20 DIAGNOSIS — I10 Essential (primary) hypertension: Secondary | ICD-10-CM

## 2023-05-24 ENCOUNTER — Ambulatory Visit: Admitting: Student

## 2023-05-24 ENCOUNTER — Encounter: Payer: Self-pay | Admitting: Student

## 2023-05-24 VITALS — BP 120/68 | HR 74 | Temp 98.1°F | Resp 19 | Ht 71.0 in | Wt 255.0 lb

## 2023-05-24 DIAGNOSIS — E7849 Other hyperlipidemia: Secondary | ICD-10-CM | POA: Diagnosis not present

## 2023-05-24 DIAGNOSIS — E1122 Type 2 diabetes mellitus with diabetic chronic kidney disease: Secondary | ICD-10-CM

## 2023-05-24 DIAGNOSIS — L308 Other specified dermatitis: Secondary | ICD-10-CM | POA: Diagnosis not present

## 2023-05-24 DIAGNOSIS — I10 Essential (primary) hypertension: Secondary | ICD-10-CM | POA: Diagnosis not present

## 2023-05-24 DIAGNOSIS — N1831 Chronic kidney disease, stage 3a: Secondary | ICD-10-CM | POA: Diagnosis not present

## 2023-05-24 DIAGNOSIS — F321 Major depressive disorder, single episode, moderate: Secondary | ICD-10-CM

## 2023-05-24 MED ORDER — PREDNISONE 20 MG PO TABS
20.0000 mg | ORAL_TABLET | Freq: Every day | ORAL | 0 refills | Status: AC
Start: 2023-05-24 — End: 2023-05-29

## 2023-05-24 MED ORDER — TRIAMCINOLONE ACETONIDE 40 MG/ML IJ SUSP
40.0000 mg | Freq: Once | INTRAMUSCULAR | Status: AC
  Administered 2023-05-24: 40 mg via INTRAMUSCULAR

## 2023-05-24 NOTE — Assessment & Plan Note (Signed)
 The patient has been actively managing diet and medication, with blood sugar levels showing improvement. The goal is to reduce hemoglobin A1c below 7.  Tests: - Rechecked hemoglobin A1c and kidney function.  Follow-Up: - Scheduled follow-up in three months for blood pressure and diabetes management review.

## 2023-05-24 NOTE — Assessment & Plan Note (Signed)
 The patient is on atorvastatin and reports no adverse effects. Cholesterol management is ongoing.

## 2023-05-24 NOTE — Assessment & Plan Note (Signed)
 The patients depression has improved with use of Bupropion. No changes made, we will continue to follow.

## 2023-05-24 NOTE — Assessment & Plan Note (Signed)
 The patient's blood pressure is well-controlled with lisinopril and amlodipine. The patient reported itching possibly related to lisinopril, but no direct correlation was confirmed.  Tests: - Rechecked kidney function. - Ordered labs to assess current condition.  Patient Education: - Educated on monitoring blood sugar levels.

## 2023-05-24 NOTE — Patient Instructions (Addendum)
 Benadryl 25 mg every 6 hrs and hydrocortisone cream as needed.   Use moisturizing lotions like cetaphil after drying from shower.

## 2023-05-24 NOTE — Progress Notes (Signed)
 Established Patient Office Visit  Subjective   Patient ID: Daniel Conner, male    DOB: Jul 17, 1960  Age: 63 y.o. MRN: [id number]  Chief Complaint  Patient presents with   Follow-up    HPI  Daniel Conner is a 63 year old male who returns for 3 month follow up   The patient reported good control of blood pressure at home, with recent measurements of 120/68 mmHg. The patient is on lisinopril, amlodipine, and Farxiga for kidney protection, with no dizziness, headaches, or fatigue noted. No swelling in hands or feet reported.  The patient has been managing diabetes by cutting out fried foods, rice, and potatoes, and increased water intake. His last A1c was 8.2, he has been using Ozempic and metformin tolerating without concerns for hypoglycemia.  The patient is checking his blood sugar levels daily and notes fasting blood sugars to range from 150-160.  The patient is on atorvastatin for cholesterol management and reports no adverse effects like body aches or dark urine.  The patient's depression has improved on buproprion 150 mg and reports no mood disturbances or suicidal ideations. He says that he feels better and is no longer having crying spells or sadness.   The patient has been experiencing itching for about a month, although the timing is uncertain he believes it could be from Lisinopril, he has been on this since October 2024. There are red bumps and  hives located various areas, including the left  ankle, web of fingers, chest and upper back. He reports itching is persistent, sometimes worsening when a shower is skipped. He has not used any OTC for relief and denies change in laundry or soap products, he has not seen any insects.     Review of Systems  Constitutional: Negative.   HENT: Negative.    Respiratory: Negative.    Cardiovascular: Negative.   Gastrointestinal: Negative.   Genitourinary: Negative.   Musculoskeletal: Negative.   Skin:  Positive for itching.   Neurological: Negative.   Endo/Heme/Allergies: Negative.   Psychiatric/Behavioral: Negative.        Objective:     BP 120/68   Pulse 74   Temp 98.1 F (36.7 C)   Resp 19   Ht 5\' 11"  (1.803 m)   Wt 255 lb (115.7 kg)   SpO2 99%   BMI 35.57 kg/m    Physical Exam Vitals reviewed.  Constitutional:      Appearance: Normal appearance.  Cardiovascular:     Rate and Rhythm: Normal rate and regular rhythm.     Pulses: Normal pulses.     Heart sounds: Normal heart sounds. No murmur heard. Pulmonary:     Effort: Pulmonary effort is normal.     Breath sounds: Normal breath sounds. No stridor. No wheezing or rhonchi.  Abdominal:     General: Bowel sounds are normal.     Palpations: Abdomen is soft.  Skin:    General: Skin is warm and dry.     Capillary Refill: Capillary refill takes less than 2 seconds.     Findings: Lesion and rash present. Rash is urticarial. Rash is not crusting or nodular.     Comments: Left ankle-3 small lesions, chest and lower back with welps and multiple red lesions. No drainage, crusting noted.   Neurological:     General: No focal deficit present.     Mental Status: He is alert and oriented to person, place, and time.  Psychiatric:        Mood  and Affect: Mood normal.        Behavior: Behavior normal.      No results found for any visits on 05/24/23.  Last metabolic panel Lab Results  Component Value Date   GLUCOSE 200 (H) 03/07/2023   NA 142 03/07/2023   K 5.3 (H) 03/07/2023   CL 109 (H) 03/07/2023   CO2 17 (L) 03/07/2023   BUN 28 (H) 03/07/2023   CREATININE 2.07 (H) 03/07/2023   EGFR 35 (L) 03/07/2023   CALCIUM 9.8 03/07/2023   PROT 7.3 03/07/2023   ALBUMIN 4.4 03/07/2023   LABGLOB 2.9 03/07/2023   BILITOT 0.2 03/07/2023   ALKPHOS 73 03/07/2023   AST 18 03/07/2023   ALT 24 03/07/2023   ANIONGAP 9 01/29/2015   Last lipids Lab Results  Component Value Date   CHOL 162 03/07/2023   HDL 34 (L) 03/07/2023   LDLCALC 91 03/07/2023    TRIG 218 (H) 03/07/2023   CHOLHDL 4.8 03/07/2023   Last hemoglobin A1c Lab Results  Component Value Date   HGBA1C 8.2 (H) 03/07/2023      The 10-year ASCVD risk score (Arnett DK, et al., 2019) is: 24%    Assessment & Plan:   Problem List Items Addressed This Visit     Hypertension   The patient's blood pressure is well-controlled with lisinopril and amlodipine. The patient reported itching possibly related to lisinopril, but no direct correlation was confirmed.  Tests: - Rechecked kidney function. - Ordered labs to assess current condition.  Patient Education: - Educated on monitoring blood sugar levels.        Relevant Orders   Comprehensive metabolic panel with GFR   Depression, major, single episode, moderate (HCC)   The patients depression has improved with use of Bupropion. No changes made, we will continue to follow.        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 The goal is to reduce hemoglobin A1c below 7.  Tests: - Rechecked hemoglobin A1c and kidney function.  Follow-Up: - Scheduled follow-up in three months for blood pressure and diabetes management review.       Relevant Orders   Hemoglobin A1c   Pruritic dermatitis    The patient has experienced itching for a month, possibly related to an inflammatory response or contact with allergens. Hives were observed on examination.  Treatment: - IM kenalog 40 mg once in office -Medications: Prescribed prednisone burst pack, Benadryl 25mg  every six hours for itching, continued hydrocortisone cream for topical application. - Increase water intake and use hypoallergenic moisturizers like Cetaphil or CeraVe.  Patient Education: - Advised on potential side effects of prednisone and Benadryl. - Educated on dietary changes and monitoring blood sugar levels.  Disposition: - Provided  instructions for steroid use and continued monitoring of symptoms related to itching and potential allergen exposure.       Relevant Medications   predniSONE (DELTASONE) 20 MG tablet   Other hyperlipidemia   The patient is on atorvastatin and reports no adverse effects. Cholesterol management is ongoing.       Relevant Orders   Lipid Profile    Return in about 3 months (around 08/23/2023).    Edwena Blow, NP

## 2023-05-24 NOTE — Assessment & Plan Note (Signed)
 The patient has experienced itching for a month, possibly related to an inflammatory response or contact with allergens. Hives were observed on examination.  Treatment: - IM kenalog 40 mg once in office -Medications: Prescribed prednisone burst pack, Benadryl 25mg  every six hours for itching, continued hydrocortisone cream for topical application. - Increase water intake and use hypoallergenic moisturizers like Cetaphil or CeraVe.  Patient Education: - Advised on potential side effects of prednisone and Benadryl. - Educated on dietary changes and monitoring blood sugar levels.  Disposition: - Provided instructions for steroid use and continued monitoring of symptoms related to itching and potential allergen exposure.

## 2023-05-25 ENCOUNTER — Ambulatory Visit: Payer: No Typology Code available for payment source | Admitting: Student

## 2023-05-25 LAB — COMPREHENSIVE METABOLIC PANEL WITH GFR
ALT: 20 IU/L (ref 0–44)
AST: 16 IU/L (ref 0–40)
Albumin: 4.5 g/dL (ref 3.9–4.9)
Alkaline Phosphatase: 100 IU/L (ref 44–121)
BUN/Creatinine Ratio: 11 (ref 10–24)
BUN: 36 mg/dL — ABNORMAL HIGH (ref 8–27)
Bilirubin Total: 0.2 mg/dL (ref 0.0–1.2)
CO2: 15 mmol/L — ABNORMAL LOW (ref 20–29)
Calcium: 9.6 mg/dL (ref 8.6–10.2)
Chloride: 106 mmol/L (ref 96–106)
Creatinine, Ser: 3.33 mg/dL — ABNORMAL HIGH (ref 0.76–1.27)
Globulin, Total: 3.1 g/dL (ref 1.5–4.5)
Glucose: 135 mg/dL — ABNORMAL HIGH (ref 70–99)
Potassium: 5.4 mmol/L — ABNORMAL HIGH (ref 3.5–5.2)
Sodium: 138 mmol/L (ref 134–144)
Total Protein: 7.6 g/dL (ref 6.0–8.5)
eGFR: 20 mL/min/{1.73_m2} — ABNORMAL LOW (ref >59)

## 2023-05-25 LAB — LIPID PANEL
Chol/HDL Ratio: 3.6 ratio (ref 0.0–5.0)
Cholesterol, Total: 93 mg/dL — ABNORMAL LOW (ref 100–199)
HDL: 26 mg/dL — ABNORMAL LOW (ref >39)
LDL Chol Calc (NIH): 37 mg/dL (ref 0–99)
Triglycerides: 182 mg/dL — ABNORMAL HIGH (ref 0–149)
VLDL Cholesterol Cal: 30 mg/dL (ref 5–40)

## 2023-05-25 LAB — HEMOGLOBIN A1C
Est. average glucose Bld gHb Est-mCnc: 189 mg/dL
Hgb A1c MFr Bld: 8.2 % — ABNORMAL HIGH (ref 4.8–5.6)

## 2023-05-26 ENCOUNTER — Other Ambulatory Visit: Payer: Self-pay | Admitting: Student

## 2023-05-26 DIAGNOSIS — F321 Major depressive disorder, single episode, moderate: Secondary | ICD-10-CM

## 2023-05-30 ENCOUNTER — Other Ambulatory Visit: Payer: Self-pay | Admitting: Student

## 2023-05-30 MED ORDER — ATORVASTATIN CALCIUM 20 MG PO TABS: 20.0000 mg | ORAL_TABLET | Freq: Every day | ORAL | 0 refills | Status: DC

## 2023-06-13 ENCOUNTER — Ambulatory Visit: Admitting: Student

## 2023-06-13 DIAGNOSIS — I10 Essential (primary) hypertension: Secondary | ICD-10-CM

## 2023-06-13 NOTE — Progress Notes (Signed)
 Nurse Visit Lab Draw CMP

## 2023-06-14 ENCOUNTER — Other Ambulatory Visit: Payer: Self-pay | Admitting: Student

## 2023-06-14 DIAGNOSIS — N179 Acute kidney failure, unspecified: Secondary | ICD-10-CM | POA: Insufficient documentation

## 2023-06-14 LAB — COMPREHENSIVE METABOLIC PANEL WITH GFR
ALT: 22 IU/L (ref 0–44)
AST: 16 IU/L (ref 0–40)
Albumin: 4.6 g/dL (ref 3.9–4.9)
Alkaline Phosphatase: 81 IU/L (ref 44–121)
BUN/Creatinine Ratio: 16 (ref 10–24)
BUN: 42 mg/dL — ABNORMAL HIGH (ref 8–27)
Bilirubin Total: 0.4 mg/dL (ref 0.0–1.2)
CO2: 16 mmol/L — ABNORMAL LOW (ref 20–29)
Calcium: 9.9 mg/dL (ref 8.6–10.2)
Chloride: 107 mmol/L — ABNORMAL HIGH (ref 96–106)
Creatinine, Ser: 2.69 mg/dL — ABNORMAL HIGH (ref 0.76–1.27)
Globulin, Total: 2.9 g/dL (ref 1.5–4.5)
Glucose: 165 mg/dL — ABNORMAL HIGH (ref 70–99)
Potassium: 6 mmol/L — ABNORMAL HIGH (ref 3.5–5.2)
Sodium: 137 mmol/L (ref 134–144)
Total Protein: 7.5 g/dL (ref 6.0–8.5)
eGFR: 26 mL/min/{1.73_m2} — ABNORMAL LOW (ref >59)

## 2023-06-14 MED ORDER — LOKELMA 10 G PO PACK: 10.0000 g | PACK | Freq: Every day | ORAL | 0 refills | Status: AC

## 2023-06-14 MED ORDER — LABETALOL HCL 100 MG PO TABS: 100.0000 mg | ORAL_TABLET | Freq: Two times a day (BID) | ORAL | 1 refills | Status: DC

## 2023-06-14 NOTE — Progress Notes (Signed)
 Your recent lab work shows worsening kidney function. We need to get on top this ASAP by the following plan:  Stop lisinopril  and metformin - you will use labetalol 100 mg twice daily along with your current amlodipine  for HTN regulation.  Your potassium level is above normal, it is important to lower this. You will use Lokelma 10 grams for 2 days. This will help decrease the potassium level.  We will have samples at the front desk.  Return to the office on Monday for a blood draw.    I will order a renal ultrasound to look for any blockages in your kidneys at [facility].

## 2023-06-14 NOTE — Progress Notes (Signed)
 Patient called.  Left message for patient to call back.  I have called and left a voicemail for the patient to return our phone call. The patient needs to be informed "Your recent lab work shows worsening kidney function. We need to get on top this ASAP by the following plan:   Stop lisinopril  and metformin - you will use labetalol 100 mg twice daily along with your current amlodipine  for HTN regulation.  Your potassium level is above normal, it is important to lower this. You will use Lokelma 10 grams for 2 days. This will help decrease the potassium level.  We will have samples at the front desk.  Return to the office on Monday for a blood draw.    I will order a renal ultrasound to look for any blockages in your kidneys at [facility]. ".

## 2023-06-14 NOTE — Progress Notes (Signed)
 Patient called.  Patient aware.  The patient returned my phone call and I have informed him of the following message "Your recent lab work shows worsening kidney function. We need to get on top this ASAP by the following plan:   Stop lisinopril  and metformin - you will use labetalol 100 mg twice daily along with your current amlodipine  for HTN regulation.  Your potassium level is above normal, it is important to lower this. You will use Lokelma 10 grams for 2 days. This will help decrease the potassium level.  We will have samples at the front desk.  Return to the office on Monday for a blood draw.    I will order a renal ultrasound to look for any blockages in your kidneys at [facility]. ".  The patient stated he will come in today to pick up his samples of Lokelma, and is scheduling his nurse visit for a blood draw on Monday.

## 2023-06-19 ENCOUNTER — Ambulatory Visit

## 2023-06-19 DIAGNOSIS — I10 Essential (primary) hypertension: Secondary | ICD-10-CM | POA: Diagnosis not present

## 2023-06-19 DIAGNOSIS — E119 Type 2 diabetes mellitus without complications: Secondary | ICD-10-CM | POA: Diagnosis not present

## 2023-06-20 ENCOUNTER — Other Ambulatory Visit: Payer: Self-pay | Admitting: Student

## 2023-06-20 DIAGNOSIS — N200 Calculus of kidney: Secondary | ICD-10-CM | POA: Insufficient documentation

## 2023-06-20 DIAGNOSIS — N179 Acute kidney failure, unspecified: Secondary | ICD-10-CM

## 2023-06-20 NOTE — Progress Notes (Signed)
 Results of Renal US  have been reviewed and show no signs of obstruction. However, there kidney stones present in your bladder and both kidneys with largest is in the right kidney. I will send a referral to urology.  Dr. Ariel Begun would like to see you back in the office on Wednesday to recheck your kidney function and potassium level. At that time he will decide if we need to refer you to a nephrologist.

## 2023-06-20 NOTE — Progress Notes (Signed)
 Patient called.  Left message for patient to call back.  /I have called and left a voicemail for the patient to return our phone call. The patient needs to be informed "Results of Renal US  have been reviewed and show no signs of obstruction. However, there kidney stones present in your bladder and both kidneys with largest is in the right kidney. I will send a referral to urology.  Dr. Ariel Begun would like to see you back in the office on Wednesday to recheck your kidney function and potassium level. At that time he will decide if we need to refer you to a nephrologist. ".

## 2023-06-20 NOTE — Progress Notes (Signed)
 Patient was contacted and notified of recent US  renal and to schedule follow up appointment. The patient's potassium 7 days ago was 6.0, he was instructed to use Lokelma  for 2 days and follow up within the next week. Unfortunately, the patient has not received medication from the pharmacy. I personally spoke with him and have instructed him to go the the ER for hyperkalemia and possible intervention.   The patient verbalized understanding. He will be scheduled to return on May 12th for follow up.

## 2023-06-20 NOTE — Progress Notes (Signed)
 Patient called.  Patient aware.  The patient returned our phone call and I have informed him "Results of Renal US  have been reviewed and show no signs of obstruction. However, there kidney stones present in your bladder and both kidneys with largest is in the right kidney. I will send a referral to urology.  Dr. Ariel Begun would like to see you back in the office on Wednesday to recheck your kidney function and potassium level. At that time he will decide if we need to refer you to a nephrologist. ".

## 2023-06-21 ENCOUNTER — Ambulatory Visit: Admitting: Internal Medicine

## 2023-06-26 ENCOUNTER — Ambulatory Visit: Admitting: Internal Medicine

## 2023-06-26 ENCOUNTER — Encounter: Payer: Self-pay | Admitting: Internal Medicine

## 2023-06-26 VITALS — BP 128/78 | HR 73 | Temp 97.7°F | Resp 18 | Ht 71.0 in | Wt 248.5 lb

## 2023-06-26 DIAGNOSIS — N200 Calculus of kidney: Secondary | ICD-10-CM

## 2023-06-26 DIAGNOSIS — I10 Essential (primary) hypertension: Secondary | ICD-10-CM

## 2023-06-26 DIAGNOSIS — R809 Proteinuria, unspecified: Secondary | ICD-10-CM | POA: Diagnosis not present

## 2023-06-26 DIAGNOSIS — E1129 Type 2 diabetes mellitus with other diabetic kidney complication: Secondary | ICD-10-CM | POA: Diagnosis not present

## 2023-06-26 MED ORDER — TAMSULOSIN HCL 0.4 MG PO CAPS: 0.4000 mg | ORAL_CAPSULE | Freq: Every day | ORAL | 6 refills | Status: DC

## 2023-06-26 MED ORDER — DAPAGLIFLOZIN PROPANEDIOL 10 MG PO TABS: 10.0000 mg | ORAL_TABLET | Freq: Every day | ORAL | 6 refills | Status: DC

## 2023-06-26 NOTE — Progress Notes (Signed)
 Office Visit  Subjective   Patient ID: Daniel Conner   DOB: Apr 06, 1960   Age: 63 y.o.   MRN: [id number]   Chief Complaint Chief Complaint  Patient presents with   Follow-up    Follow up     History of Present Illness 63 years old male with history of acute kidney injury and his potassium was 6.0 on April 29 and we send look Asthma for him but he could not get that medicine so he was asked to go to the emergency room on Thursday.  His potassium was 5.2 in the emergency room so he was advised to follow-up with us .  He has acute kidney injury where his renal function was normal 7 month ago and since then his creatinine went up to 2.69 with BUN 42 and GFR 26. He was started on lisinopril  that was stopped last week.  He has history of kidney stone but there is no hydronephrosis.  He has a history of kidney stone in the past and he has 2 large kidney stone on right and left kidney but no obstruction.  He denies having any pain.   He has diabetes mellitus with microalbuminuria and he was started on Farxiga  where he has albumin urea add 7 month ago was 2 g and 3 months ago it was 671. He says that he also stop taking metformin  and Farxiga .  He take Ozempic  and check his blood sugar.  His blood sugar this morning was 150.  He has history of hypertension and his blood pressure is controlled.  He take amlodipine  5 mg daily  Past Medical History Past Medical History:  Diagnosis Date   Arthritis    Carpal tunnel syndrome    bilateral   ED (erectile dysfunction)    Fatty liver    GERD (gastroesophageal reflux disease)    occ when eat late at night   H/O degenerative disc disease    History of kidney stones    Shortness of breath dyspnea    witrh exertion     Allergies Allergies  Allergen Reactions   Hydrocodone Other (See Comments)    Pt states he took cough medicine with hydrocodone and it made him very dizzy and light headed.     Review of Systems Review of Systems  Constitutional:  Negative.   HENT: Negative.    Respiratory: Negative.    Cardiovascular: Negative.   Gastrointestinal: Negative.   Neurological: Negative.        Objective:    Vitals BP 128/78   Pulse 73   Temp 97.7 F (36.5 C)   Resp 18   Ht 5\' 11"  (1.803 m)   Wt 248 lb 8 oz (112.7 kg)   SpO2 99%   BMI 34.66 kg/m    Physical Examination Physical Exam Constitutional:      Appearance: Normal appearance.  Cardiovascular:     Rate and Rhythm: Normal rate and regular rhythm.  Pulmonary:     Effort: Pulmonary effort is normal.     Breath sounds: Normal breath sounds.  Abdominal:     General: Bowel sounds are normal.     Palpations: Abdomen is soft.  Neurological:     General: No focal deficit present.     Mental Status: He is alert and oriented to person, place, and time.        Assessment & Plan:   Diabetes mellitus with microalbuminuria (HCC)  He is on Ozempic  1 mg once a week and I will  restart Farxiga  10 mg daily for microalbuminuria.  He will watch his diet and monitor blood sugar.  Hypertension   His blood pressure is controlled.  He will continue with amlodipine  5 mg daily.  His Lisinopril  was stopped because of acute renal failure. Will re-evaluate.  Renal stone   He will drink plenty water .  I will start him on Flomax  0.4 mg daily.  I will also refer him to see urologist for bilateral kidney stone.    Return in about 1 month (around 07/27/2023).   Tita Form, MD

## 2023-06-26 NOTE — Assessment & Plan Note (Signed)
 His blood pressure is controlled.  He will continue with amlodipine  5 mg daily.  His Lisinopril  was stopped because of acute renal failure. Will re-evaluate.

## 2023-06-26 NOTE — Assessment & Plan Note (Signed)
 He will drink plenty water .  I will start him on Flomax  0.4 mg daily.  I will also refer him to see urologist for bilateral kidney stone.

## 2023-06-26 NOTE — Assessment & Plan Note (Signed)
 He is on Ozempic  1 mg once a week and I will restart Farxiga  10 mg daily for microalbuminuria.  He will watch his diet and monitor blood sugar.

## 2023-06-29 ENCOUNTER — Other Ambulatory Visit: Payer: Self-pay

## 2023-06-29 MED ORDER — GLUCOSE BLOOD VI STRP: ORAL_STRIP | 12 refills | Status: DC

## 2023-06-29 MED ORDER — ACCU-CHEK SOFTCLIX LANCETS MISC: 12 refills | Status: AC

## 2023-07-19 DIAGNOSIS — E119 Type 2 diabetes mellitus without complications: Secondary | ICD-10-CM

## 2023-07-19 DIAGNOSIS — I10 Essential (primary) hypertension: Secondary | ICD-10-CM

## 2023-07-26 ENCOUNTER — Ambulatory Visit: Admitting: Internal Medicine

## 2023-07-26 ENCOUNTER — Encounter: Payer: Self-pay | Admitting: Internal Medicine

## 2023-07-26 VITALS — BP 132/78 | HR 79 | Temp 97.2°F | Resp 18 | Ht 71.0 in | Wt 244.0 lb

## 2023-07-26 DIAGNOSIS — F321 Major depressive disorder, single episode, moderate: Secondary | ICD-10-CM

## 2023-07-26 DIAGNOSIS — E1122 Type 2 diabetes mellitus with diabetic chronic kidney disease: Secondary | ICD-10-CM | POA: Diagnosis not present

## 2023-07-26 DIAGNOSIS — N200 Calculus of kidney: Secondary | ICD-10-CM

## 2023-07-26 DIAGNOSIS — N1831 Chronic kidney disease, stage 3a: Secondary | ICD-10-CM

## 2023-07-26 DIAGNOSIS — E7849 Other hyperlipidemia: Secondary | ICD-10-CM

## 2023-07-26 DIAGNOSIS — I10 Essential (primary) hypertension: Secondary | ICD-10-CM | POA: Diagnosis not present

## 2023-07-26 MED ORDER — OZEMPIC (1 MG/DOSE) 2 MG/1.5ML ~~LOC~~ SOPN: 2.0000 mg | PEN_INJECTOR | SUBCUTANEOUS | 6 refills | Status: DC

## 2023-07-26 NOTE — Assessment & Plan Note (Signed)
 He does not have any pain and he is taking Flomax  for kidney stone.  He will follow-up with urologist

## 2023-07-26 NOTE — Assessment & Plan Note (Signed)
 His blood pressure is controlled.  His lisinopril  was stopped because of acute renal failure.

## 2023-07-26 NOTE — Progress Notes (Signed)
 Office Visit  Subjective   Patient ID: Daniel Conner   DOB: [birth year]   Age: 63 y.o.   MRN: [id number]   Chief Complaint Chief Complaint  Patient presents with   Follow-up    1 month follow up     History of Present Illness 63 years old male is here for follow-up.   He has diabetes mellitus and hyperlipidemia.  He take atorvastatin  20 mg and his lipid panel was done on April 9 shows LDL of 37. He denies any side effects from atorvastatin .    He has uncontrolled diabetes mellitus and his hemoglobin A1c on April line was 8.2. He has acute renal failure so his ACE-inhibitor was stopped.  His potassium was 6 and he was given Lokelma  for that, he took 2 doses.  He has repeat labs in the emergency room that shows potassium of 5.2. He is here today for repeat renal function.   He also has bilateral kidney stone  On ultrasound that was done May 6. There was no hydronephrosis.  I have referred him to see urologist. He  Takes Ozempic  1 mg weekly and Farxiga  10 mg daily.  His metformin  was stopped because of creatinine above 2. He is here for repeat labs today.  She has albuminurea decrease from 2 g to 6174 months ago  He has history of hypertension and his blood pressure is controlled.  He take amlodipine  5 mg daily and labetalol  100 mg twice a day.    He has depression and that is better.  Past Medical History Past Medical History:  Diagnosis Date   Arthritis    Carpal tunnel syndrome    bilateral   ED (erectile dysfunction)    Fatty liver    GERD (gastroesophageal reflux disease)    occ when eat late at night   H/O degenerative disc disease    History of kidney stones    Shortness of breath dyspnea    witrh exertion     Allergies Allergies  Allergen Reactions   Hydrocodone Other (See Comments)    Pt states he took cough medicine with hydrocodone and it made him very dizzy and light headed.     Review of Systems Review of Systems  Constitutional: Negative.   HENT: Negative.     Respiratory:  Positive for cough.   Cardiovascular: Negative.   Gastrointestinal: Negative.   Neurological: Negative.        Objective:    Vitals BP 132/78   Pulse 79   Temp (!) 97.2 F (36.2 C)   Resp 18   Ht 5' 11 (1.803 m)   Wt 244 lb (110.7 kg)   SpO2 96%   BMI 34.03 kg/m    Physical Examination Physical Exam Constitutional:      Appearance: Normal appearance.  HENT:     Head: Normocephalic and atraumatic.  Eyes:     Extraocular Movements: Extraocular movements intact.     Conjunctiva/sclera: Conjunctivae normal.  Cardiovascular:     Rate and Rhythm: Normal rate and regular rhythm.     Heart sounds: Normal heart sounds.  Pulmonary:     Effort: Pulmonary effort is normal.     Breath sounds: Normal breath sounds.  Abdominal:     General: Bowel sounds are normal.     Palpations: Abdomen is soft.  Neurological:     General: No focal deficit present.     Mental Status: He is alert and oriented to person, place, and time.  Assessment & Plan:   Hypertension   His blood pressure is controlled.  His lisinopril  was stopped because of acute renal failure.  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  Renal stone  He does not have any pain and he is taking Flomax  for kidney stone.  He will follow-up with urologist  Other hyperlipidemia  His cholesterol is controlled on atorvastatin  20 mg daily.  No side effects from atorvastatin .  Depression, major, single episode, moderate (HCC)   His depression is better and he take Wellbutrin  150 mg daily.    Return in about 3 months (around 10/26/2023).   Tita Form, MD

## 2023-07-26 NOTE — Assessment & Plan Note (Signed)
 He has microalbuminuria of 2 g in January 2025 and after starting him on Farxiga  microalbuminuria decreased to 617.

## 2023-07-26 NOTE — Assessment & Plan Note (Signed)
 His depression is better and he take Wellbutrin  150 mg daily.

## 2023-07-26 NOTE — Assessment & Plan Note (Signed)
 His cholesterol is controlled on atorvastatin  20 mg daily.  No side effects from atorvastatin .

## 2023-07-27 LAB — CMP14 + ANION GAP
ALT: 30 IU/L (ref 0–44)
AST: 26 IU/L (ref 0–40)
Albumin: 4.1 g/dL (ref 3.9–4.9)
Alkaline Phosphatase: 93 IU/L (ref 44–121)
Anion Gap: 15 mmol/L (ref 10.0–18.0)
BUN/Creatinine Ratio: 9 — ABNORMAL LOW (ref 10–24)
BUN: 20 mg/dL (ref 8–27)
Bilirubin Total: 0.3 mg/dL (ref 0.0–1.2)
CO2: 17 mmol/L — ABNORMAL LOW (ref 20–29)
Calcium: 9.5 mg/dL (ref 8.6–10.2)
Chloride: 107 mmol/L — ABNORMAL HIGH (ref 96–106)
Creatinine, Ser: 2.19 mg/dL — ABNORMAL HIGH (ref 0.76–1.27)
Globulin, Total: 3.4 g/dL (ref 1.5–4.5)
Glucose: 181 mg/dL — ABNORMAL HIGH (ref 70–99)
Potassium: 5 mmol/L (ref 3.5–5.2)
Sodium: 139 mmol/L (ref 134–144)
Total Protein: 7.5 g/dL (ref 6.0–8.5)
eGFR: 33 mL/min/{1.73_m2} — ABNORMAL LOW (ref >59)

## 2023-07-28 ENCOUNTER — Ambulatory Visit: Payer: Self-pay

## 2023-08-01 ENCOUNTER — Other Ambulatory Visit: Payer: Self-pay | Admitting: Internal Medicine

## 2023-08-07 ENCOUNTER — Other Ambulatory Visit: Payer: Self-pay

## 2023-08-07 MED ORDER — LABETALOL HCL 100 MG PO TABS: 100.0000 mg | ORAL_TABLET | Freq: Two times a day (BID) | ORAL | 1 refills | Status: DC

## 2023-08-07 NOTE — Progress Notes (Signed)
 Rx Refill

## 2023-08-18 DIAGNOSIS — I10 Essential (primary) hypertension: Secondary | ICD-10-CM | POA: Diagnosis not present

## 2023-08-18 DIAGNOSIS — E119 Type 2 diabetes mellitus without complications: Secondary | ICD-10-CM | POA: Diagnosis not present

## 2023-08-21 ENCOUNTER — Ambulatory Visit: Admitting: Internal Medicine

## 2023-09-01 ENCOUNTER — Other Ambulatory Visit: Payer: Self-pay

## 2023-09-01 DIAGNOSIS — F321 Major depressive disorder, single episode, moderate: Secondary | ICD-10-CM

## 2023-09-01 MED ORDER — BUPROPION HCL ER (XL) 150 MG PO TB24: 150.0000 mg | ORAL_TABLET | Freq: Every day | ORAL | 0 refills | Status: DC

## 2023-09-18 DIAGNOSIS — E119 Type 2 diabetes mellitus without complications: Secondary | ICD-10-CM | POA: Diagnosis not present

## 2023-09-18 DIAGNOSIS — I10 Essential (primary) hypertension: Secondary | ICD-10-CM | POA: Diagnosis not present

## 2023-09-25 ENCOUNTER — Other Ambulatory Visit: Payer: Self-pay

## 2023-09-25 MED ORDER — GLUCOSE BLOOD VI STRP: ORAL_STRIP | 12 refills | Status: DC

## 2023-10-02 ENCOUNTER — Other Ambulatory Visit: Payer: Self-pay

## 2023-10-02 ENCOUNTER — Other Ambulatory Visit: Payer: Self-pay | Admitting: Internal Medicine

## 2023-10-02 MED ORDER — ACCU-CHEK GUIDE TEST VI STRP: ORAL_STRIP | 12 refills | Status: AC

## 2023-10-02 MED ORDER — RELION TRUE METRIX TEST STRIPS VI STRP: ORAL_STRIP | 12 refills | Status: AC

## 2023-10-05 ENCOUNTER — Other Ambulatory Visit: Payer: Self-pay | Admitting: Internal Medicine

## 2023-10-09 ENCOUNTER — Other Ambulatory Visit: Payer: Self-pay | Admitting: Internal Medicine

## 2023-10-18 DIAGNOSIS — E119 Type 2 diabetes mellitus without complications: Secondary | ICD-10-CM | POA: Diagnosis not present

## 2023-10-18 DIAGNOSIS — I10 Essential (primary) hypertension: Secondary | ICD-10-CM | POA: Diagnosis not present

## 2023-10-25 ENCOUNTER — Ambulatory Visit: Admitting: Internal Medicine

## 2023-10-25 ENCOUNTER — Encounter: Payer: Self-pay | Admitting: Internal Medicine

## 2023-10-25 VITALS — BP 130/80 | HR 78 | Temp 97.5°F | Resp 18 | Ht 71.0 in | Wt 245.0 lb

## 2023-10-25 DIAGNOSIS — R1319 Other dysphagia: Secondary | ICD-10-CM

## 2023-10-25 DIAGNOSIS — N1832 Chronic kidney disease, stage 3b: Secondary | ICD-10-CM

## 2023-10-25 DIAGNOSIS — E7849 Other hyperlipidemia: Secondary | ICD-10-CM | POA: Diagnosis not present

## 2023-10-25 DIAGNOSIS — I10 Essential (primary) hypertension: Secondary | ICD-10-CM

## 2023-10-25 DIAGNOSIS — E1122 Type 2 diabetes mellitus with diabetic chronic kidney disease: Secondary | ICD-10-CM | POA: Diagnosis not present

## 2023-10-25 DIAGNOSIS — F321 Major depressive disorder, single episode, moderate: Secondary | ICD-10-CM

## 2023-10-25 DIAGNOSIS — Z6834 Body mass index (BMI) 34.0-34.9, adult: Secondary | ICD-10-CM | POA: Diagnosis not present

## 2023-10-25 NOTE — Assessment & Plan Note (Signed)
His blood pressure is better.

## 2023-10-25 NOTE — Progress Notes (Signed)
 Office Visit  Subjective   Patient ID: Daniel Conner   DOB: 06/12/1960   Age: 63 y.o.   MRN: [id number]   Chief Complaint Chief Complaint  Patient presents with   follow uo    3 month follow up     History of Present Illness 63 years old male is here for follow-up.  He says that he ran out of test strips and has not checked his sugar in few weeks.His last Hb A1c was 8.2% in June 2025. He takes Ozympic 2 mg every week. He get sample from us  as his insurance did not cover that. He has gained 1 pound since last visit. He also takes farxiga  10 mg daily. He has ARF and his GFR was slightly better 3 months ago and his proteinurea was better 3 months ago. His ACE inhibitor was stopped due to ARF. His metformin  was stopped due to ARF.  He is c/o difficulty in swallowing as he feel food stuck middle of his chest and t take long time for food to come down. He get chest pain when he eat.   He has hyperlipidemia.  He take atorvastatin  20 mg and his lipid panel was done on April 9 shows LDL of 37. He denies any side effects from atorvastatin .   He has history of hypertension and his blood pressure is controlled.  He take amlodipine  5 mg daily and labetalol  100 mg twice a day.     He has depression and that is better.  Past Medical History Past Medical History:  Diagnosis Date   Arthritis    Carpal tunnel syndrome    bilateral   ED (erectile dysfunction)    Fatty liver    GERD (gastroesophageal reflux disease)    occ when eat late at night   H/O degenerative disc disease    History of kidney stones    Shortness of breath dyspnea    witrh exertion     Allergies Allergies  Allergen Reactions   Hydrocodone Other (See Comments)    Pt states he took cough medicine with hydrocodone and it made him very dizzy and light headed.     Review of Systems Review of Systems  Constitutional: Negative.   HENT: Negative.    Respiratory: Negative.    Cardiovascular: Negative.    Gastrointestinal: Negative.   Neurological: Negative.        Objective:    Vitals BP 130/80   Pulse 78   Temp (!) 97.5 F (36.4 C)   Resp 18   Ht 5' 11 (1.803 m)   Wt 245 lb (111.1 kg)   SpO2 98%   BMI 34.17 kg/m    Physical Examination Physical Exam Constitutional:      Appearance: Normal appearance. He is obese.  HENT:     Head: Normocephalic and atraumatic.  Cardiovascular:     Rate and Rhythm: Normal rate and regular rhythm.     Heart sounds: Normal heart sounds.  Pulmonary:     Effort: Pulmonary effort is normal.     Breath sounds: Normal breath sounds.  Abdominal:     General: Bowel sounds are normal.     Palpations: Abdomen is soft.  Neurological:     General: No focal deficit present.     Mental Status: He is alert and oriented to person, place, and time.        Assessment & Plan:   Hypertension His blood pressure is better.   Esophageal dysphagia I will  refer him to see GI for further valuation possible esophageal dilatation.  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 His LDL is target controlled.   Depression, major, single episode, moderate (HCC) Better.  BMI 34.0-34.9,adult Will continue to monitor.    Return in about 3 months (around 01/24/2024).   Roetta Dare, MD

## 2023-10-25 NOTE — Assessment & Plan Note (Signed)
 Better

## 2023-10-25 NOTE — Assessment & Plan Note (Signed)
 Will continue to monitor. His LDL is target controlled.

## 2023-10-25 NOTE — Assessment & Plan Note (Signed)
 Will continue to monitor.

## 2023-10-25 NOTE — Assessment & Plan Note (Signed)
 I will refer him to see GI for further valuation possible esophageal dilatation.

## 2023-10-25 NOTE — Assessment & Plan Note (Signed)
 I will repeat Hb A1c and CMP

## 2023-10-26 LAB — CMP14 + ANION GAP
ALT: 30 IU/L (ref 0–44)
AST: 22 IU/L (ref 0–40)
Albumin: 4.5 g/dL (ref 3.9–4.9)
Alkaline Phosphatase: 105 IU/L (ref 44–121)
Anion Gap: 16 mmol/L (ref 10.0–18.0)
BUN/Creatinine Ratio: 10 (ref 10–24)
BUN: 20 mg/dL (ref 8–27)
Bilirubin Total: 0.4 mg/dL (ref 0.0–1.2)
CO2: 18 mmol/L — ABNORMAL LOW (ref 20–29)
Calcium: 10.2 mg/dL (ref 8.6–10.2)
Chloride: 104 mmol/L (ref 96–106)
Creatinine, Ser: 2.05 mg/dL — ABNORMAL HIGH (ref 0.76–1.27)
Globulin, Total: 3.2 g/dL (ref 1.5–4.5)
Glucose: 189 mg/dL — ABNORMAL HIGH (ref 70–99)
Potassium: 5.2 mmol/L (ref 3.5–5.2)
Sodium: 138 mmol/L (ref 134–144)
Total Protein: 7.7 g/dL (ref 6.0–8.5)
eGFR: 36 mL/min/1.73 — ABNORMAL LOW (ref >59)

## 2023-10-26 LAB — HEMOGLOBIN A1C
Est. average glucose Bld gHb Est-mCnc: 209 mg/dL
Hgb A1c MFr Bld: 8.9 % — ABNORMAL HIGH (ref 4.8–5.6)

## 2023-10-26 LAB — MICROALBUMIN / CREATININE URINE RATIO
Creatinine, Urine: 46 mg/dL
Microalb/Creat Ratio: 360 mg/g{creat} — ABNORMAL HIGH (ref 0–29)
Microalbumin, Urine: 165.7 ug/mL

## 2023-10-27 ENCOUNTER — Other Ambulatory Visit: Payer: Self-pay | Admitting: Internal Medicine

## 2023-10-27 MED ORDER — GLIMEPIRIDE 4 MG PO TABS: 4.0000 mg | ORAL_TABLET | Freq: Every day | ORAL | 6 refills | Status: AC

## 2023-10-30 ENCOUNTER — Ambulatory Visit: Payer: Self-pay

## 2023-11-08 NOTE — Progress Notes (Signed)
 Patient called.  Unable to reach patient. Sending a letter  Dr caleen: His labs are better except sugar is very high. He need to cut down carbohydrate intake, keep taking current medication and I have sent another medicine glimeperide 4 mg daily.

## 2023-11-17 DIAGNOSIS — I10 Essential (primary) hypertension: Secondary | ICD-10-CM | POA: Diagnosis not present

## 2023-11-17 DIAGNOSIS — E119 Type 2 diabetes mellitus without complications: Secondary | ICD-10-CM | POA: Diagnosis not present

## 2023-11-26 ENCOUNTER — Other Ambulatory Visit: Payer: Self-pay | Admitting: Internal Medicine

## 2023-11-26 DIAGNOSIS — F321 Major depressive disorder, single episode, moderate: Secondary | ICD-10-CM

## 2023-11-28 ENCOUNTER — Other Ambulatory Visit: Payer: Self-pay | Admitting: Internal Medicine

## 2023-12-29 ENCOUNTER — Other Ambulatory Visit: Payer: Self-pay | Admitting: Internal Medicine

## 2024-01-02 ENCOUNTER — Other Ambulatory Visit: Payer: Self-pay | Admitting: Internal Medicine

## 2024-01-02 MED ORDER — DAPAGLIFLOZIN PROPANEDIOL 10 MG PO TABS: 10.0000 mg | ORAL_TABLET | Freq: Every day | ORAL | 6 refills | Status: AC

## 2024-01-03 ENCOUNTER — Other Ambulatory Visit: Payer: Self-pay

## 2024-01-03 MED ORDER — ATORVASTATIN CALCIUM 20 MG PO TABS
20.0000 mg | ORAL_TABLET | Freq: Every day | ORAL | 0 refills | Status: AC
Start: 2024-01-03 — End: ?

## 2024-01-16 ENCOUNTER — Other Ambulatory Visit: Payer: Self-pay | Admitting: Internal Medicine

## 2024-01-24 ENCOUNTER — Ambulatory Visit: Admitting: Internal Medicine

## 2024-01-24 ENCOUNTER — Encounter: Payer: Self-pay | Admitting: Internal Medicine

## 2024-01-24 VITALS — BP 126/80 | HR 66 | Temp 97.6°F | Resp 18 | Ht 71.0 in | Wt 256.4 lb

## 2024-01-24 DIAGNOSIS — R39198 Other difficulties with micturition: Secondary | ICD-10-CM | POA: Insufficient documentation

## 2024-01-24 DIAGNOSIS — E7849 Other hyperlipidemia: Secondary | ICD-10-CM | POA: Diagnosis not present

## 2024-01-24 DIAGNOSIS — E1122 Type 2 diabetes mellitus with diabetic chronic kidney disease: Secondary | ICD-10-CM

## 2024-01-24 DIAGNOSIS — N1832 Chronic kidney disease, stage 3b: Secondary | ICD-10-CM

## 2024-01-24 DIAGNOSIS — I10 Essential (primary) hypertension: Secondary | ICD-10-CM

## 2024-01-24 LAB — POCT URINALYSIS DIPSTICK
Bilirubin, UA: NEGATIVE
Blood, UA: NEGATIVE
Glucose, UA: POSITIVE — AB
Ketones, UA: NEGATIVE
Leukocytes, UA: NEGATIVE
Nitrite, UA: NEGATIVE
Protein, UA: POSITIVE — AB
Spec Grav, UA: >=1.03 — AB (ref 1.010–1.025)
Urobilinogen, UA: 0.2 U/dL
pH, UA: 5.5 (ref 5.0–8.0)

## 2024-01-24 MED ORDER — TAMSULOSIN HCL 0.4 MG PO CAPS: 0.4000 mg | ORAL_CAPSULE | Freq: Every day | ORAL | 6 refills | Status: AC

## 2024-01-24 MED ORDER — OZEMPIC (1 MG/DOSE) 2 MG/1.5ML ~~LOC~~ SOPN: 2.0000 mg | PEN_INJECTOR | SUBCUTANEOUS | 6 refills | Status: AC

## 2024-01-24 MED ORDER — METFORMIN HCL ER 500 MG PO TB24: 500.0000 mg | ORAL_TABLET | Freq: Every day | ORAL | 6 refills | Status: AC

## 2024-01-24 NOTE — Assessment & Plan Note (Signed)
 He will continue with atorvastatin  20 mg daily

## 2024-01-24 NOTE — Assessment & Plan Note (Signed)
 His BMI is 35 with underlying hypertension and diabetes make it morbidly obese.  He need to watch his diet and cut down portion of his meal.

## 2024-01-24 NOTE — Assessment & Plan Note (Signed)
 His blood pressure is controlled.  I will add Ace or Arb on next visit.

## 2024-01-24 NOTE — Assessment & Plan Note (Signed)
 He has difficulty in urinating.  I have started him on Flomax  0.4 mg daily.  He also has a history of kidney stone.  I will do urine analysis and will refer him to see urologist.

## 2024-01-24 NOTE — Progress Notes (Signed)
 Office Visit  Subjective   Patient ID: Daniel Conner   DOB: [birth year]   Age: 63 y.o.   MRN: [id number]   Chief Complaint Chief Complaint  Patient presents with   Follow-up    3 month     History of Present Illness 63 years old male is here for follow-up.  He says that While urinating his urine suddenly stopped.  He has to change position and then he started urinating again.  He says that this happens quite often and sometimes every day.  He has a history of kidney stone and he has not seen any urologist for that.  He denies any burning urination.    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 He takes Farxiga  10 mg daily and Ozempic  2 mg once a week.  He also takes glimepiride  4 mg daily.  He says that his blood sugar this morning was 154 but usually it is around 120 range.  His last hemoglobin A1c was 8.9 in September 05/2023.  He has not seen eye doctor for diabetic eye exam.  He has microalbuminuria of 2 g and since he was started on Farxiga  his microalbuminuria was 350 in September.  His kidney function is also slowly getting better.  He has hyperlipidemia.  He take atorvastatin  20 mg and his lipid panel was done on April 9 shows LDL of 37. He denies any side effects from atorvastatin .   He has history of hypertension and his blood pressure is controlled.  He take amlodipine  5 mg daily and labetalol  100 mg twice a day.   His blood pressure is controlled.    He has depression and that is better.  Past Medical History Past Medical History:  Diagnosis Date   Arthritis    Carpal tunnel syndrome    bilateral   ED (erectile dysfunction)    Fatty liver    GERD (gastroesophageal reflux disease)    occ when eat late at night   H/O degenerative disc disease    History of kidney stones    Shortness of breath dyspnea    witrh exertion     Allergies Allergies  Allergen Reactions   Hydrocodone Other (See Comments)    Pt states he took cough medicine  with hydrocodone and it made him very dizzy and light headed.     Review of Systems Review of Systems  Constitutional: Negative.   HENT: Negative.    Respiratory: Negative.    Cardiovascular: Negative.   Gastrointestinal: Negative.   Neurological: Negative.        Objective:    Vitals BP 126/80 (BP Location: Left Arm, Patient Position: Sitting, Cuff Size: Normal)   Pulse 66   Temp 97.6 F (36.4 C)   Resp 18   Ht 5' 11 (1.803 m)   Wt 256 lb 6 oz (116.3 kg)   SpO2 99%   BMI 35.76 kg/m    Physical Examination Physical Exam Constitutional:      Appearance: Normal appearance.  HENT:     Head: Normocephalic and atraumatic.  Eyes:     Extraocular Movements: Extraocular movements intact.     Pupils: Pupils are equal, round, and reactive to light.  Cardiovascular:     Rate and Rhythm: Normal rate and regular rhythm.     Heart sounds: Normal heart sounds.  Pulmonary:     Effort: Pulmonary effort is normal.     Breath sounds: Normal breath sounds.  Abdominal:  General: Bowel sounds are normal.     Palpations: Abdomen is soft.  Neurological:     General: No focal deficit present.     Mental Status: He is alert and oriented to person, place, and time.        Assessment & Plan:   Hypertension   His blood pressure is controlled.  I will add Ace or Arb on next visit.  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  I will also add metformin  500 mg daily because his GFR was  Better from 27 to 36.  Other hyperlipidemia  He will continue with atorvastatin  20 mg daily.  Morbid obesity (HCC)   His BMI is 35 with underlying hypertension and diabetes make it morbidly obese.  He need to watch his diet and cut down portion of his meal.  Difficulty in urination   He has difficulty in urinating.  I have started him on Flomax  0.4 mg daily.  He also has a history of kidney stone.  I will do urine analysis and will refer him to  see urologist.    Return in about 3 months (around 04/23/2024).   Roetta Dare, MD

## 2024-01-24 NOTE — Assessment & Plan Note (Addendum)
 I will repeat hemoglobin A1c and CMP today.  I will also add metformin  500 mg daily because his GFR was  Better from 27 to 36.

## 2024-01-25 LAB — CMP14 + ANION GAP
ALT: 28 IU/L (ref 0–44)
AST: 23 IU/L (ref 0–40)
Albumin: 4.6 g/dL (ref 3.9–4.9)
Alkaline Phosphatase: 95 IU/L (ref 47–123)
Anion Gap: 17 mmol/L (ref 10.0–18.0)
BUN/Creatinine Ratio: 14 (ref 10–24)
BUN: 33 mg/dL — ABNORMAL HIGH (ref 8–27)
Bilirubin Total: 0.3 mg/dL (ref 0.0–1.2)
CO2: 15 mmol/L — ABNORMAL LOW (ref 20–29)
Calcium: 9.4 mg/dL (ref 8.6–10.2)
Chloride: 108 mmol/L — ABNORMAL HIGH (ref 96–106)
Creatinine, Ser: 2.37 mg/dL — ABNORMAL HIGH (ref 0.76–1.27)
Globulin, Total: 3.3 g/dL (ref 1.5–4.5)
Glucose: 134 mg/dL — ABNORMAL HIGH (ref 70–99)
Potassium: 5.5 mmol/L — ABNORMAL HIGH (ref 3.5–5.2)
Sodium: 140 mmol/L (ref 134–144)
Total Protein: 7.9 g/dL (ref 6.0–8.5)
eGFR: 30 mL/min/1.73 — ABNORMAL LOW (ref >59)

## 2024-01-25 LAB — HEMOGLOBIN A1C
Est. average glucose Bld gHb Est-mCnc: 174 mg/dL
Hgb A1c MFr Bld: 7.7 % — ABNORMAL HIGH (ref 4.8–5.6)

## 2024-01-26 ENCOUNTER — Ambulatory Visit: Payer: Self-pay

## 2024-01-26 NOTE — Progress Notes (Signed)
Patient called.  Left message for patient to call back.

## 2024-01-27 ENCOUNTER — Other Ambulatory Visit: Payer: Self-pay | Admitting: Internal Medicine

## 2024-01-29 NOTE — Progress Notes (Signed)
 Patient called.  Patient aware. His labs stable

## 2024-02-06 ENCOUNTER — Encounter: Payer: Self-pay | Admitting: Internal Medicine

## 2024-02-06 ENCOUNTER — Ambulatory Visit: Admitting: Internal Medicine

## 2024-02-06 VITALS — BP 130/90 | HR 79 | Temp 97.8°F | Resp 18 | Ht 71.0 in | Wt 256.0 lb

## 2024-02-06 DIAGNOSIS — M5441 Lumbago with sciatica, right side: Secondary | ICD-10-CM

## 2024-02-06 MED ORDER — GABAPENTIN 300 MG PO CAPS: 300.0000 mg | ORAL_CAPSULE | Freq: Every day | ORAL | 2 refills | Status: DC

## 2024-02-06 MED ORDER — METHYLPREDNISOLONE 4 MG PO TBPK: ORAL_TABLET | ORAL | 0 refills | Status: AC

## 2024-02-06 NOTE — Assessment & Plan Note (Signed)
"   I will give him Medrol  Dosepak for 6 days.  He will double the dose of glimepiride  during this time.  I will also start him on gabapentin  300 mg at nighttime.  He will put warm compresses on his back.  If he is not better then he will come back. "

## 2024-02-06 NOTE — Progress Notes (Signed)
" ° °  Acute Office Visit  Subjective:     Patient ID: Daniel Conner, male    DOB: [birth year], 63 y.o.   MRN: [id number]  Chief Complaint  Patient presents with   Office visit    Stabbing pain in butt that runs down rt. leg    HPI Patient is in today for severe pain right lower back radiating all the way to right heel started this morning.  He says that he tried to lift something up and suddenly he started having shooting pain in his right lower back and that is stabbing pain radiating to right heel.  He says that he can not walk and he can not sit because of pain.  No loss of urine or bowel. He has a history of ruptured discs before.  He has type 2 diabetes mellitus and chronic kidney disease where his GFR was 30.  Review of Systems  Musculoskeletal:  Positive for back pain.        Objective:    BP (!) 130/90   Pulse 79   Temp 97.8 F (36.6 C)   Resp 18   Ht 5' 11 (1.803 m)   Wt 256 lb (116.1 kg)   SpO2 99%   BMI 35.70 kg/m    Physical Exam Constitutional:      General: He is in acute distress.  Musculoskeletal:     Comments:   Spasm right lower back  Neurological:     General: No focal deficit present.     Mental Status: He is alert and oriented to person, place, and time.     No results found for any visits on 02/06/24.      Assessment & Plan:   Problem List Items Addressed This Visit       Nervous and Auditory   Acute right-sided low back pain with right-sided sciatica - Primary    I will give him Medrol  Dosepak for 6 days.  He will double the dose of glimepiride  during this time.  I will also start him on gabapentin  300 mg at nighttime.  He will put warm compresses on his back.  If he is not better then he will come back.      Relevant Medications   methylPREDNISolone  (MEDROL  DOSEPAK) 4 MG TBPK tablet   gabapentin  (NEURONTIN ) 300 MG capsule    No orders of the defined types were placed in this encounter.   No follow-ups on file.  Roetta Dare,  MD   "

## 2024-02-25 ENCOUNTER — Other Ambulatory Visit: Payer: Self-pay | Admitting: Internal Medicine

## 2024-02-26 ENCOUNTER — Other Ambulatory Visit: Payer: Self-pay | Admitting: Internal Medicine

## 2024-02-26 DIAGNOSIS — F321 Major depressive disorder, single episode, moderate: Secondary | ICD-10-CM

## 2024-03-01 ENCOUNTER — Other Ambulatory Visit: Payer: Self-pay

## 2024-03-01 DIAGNOSIS — I10 Essential (primary) hypertension: Secondary | ICD-10-CM

## 2024-03-01 MED ORDER — AMLODIPINE BESYLATE 5 MG PO TABS: 5.0000 mg | ORAL_TABLET | Freq: Every day | ORAL | 11 refills | Status: AC

## 2024-03-05 ENCOUNTER — Ambulatory Visit: Admitting: Internal Medicine

## 2024-03-05 ENCOUNTER — Encounter: Payer: Self-pay | Admitting: Internal Medicine

## 2024-03-05 VITALS — BP 140/90 | HR 75 | Temp 97.6°F | Resp 18 | Ht 71.0 in | Wt 252.2 lb

## 2024-03-05 DIAGNOSIS — M5441 Lumbago with sciatica, right side: Secondary | ICD-10-CM | POA: Diagnosis not present

## 2024-03-05 DIAGNOSIS — E1129 Type 2 diabetes mellitus with other diabetic kidney complication: Secondary | ICD-10-CM

## 2024-03-05 DIAGNOSIS — R809 Proteinuria, unspecified: Secondary | ICD-10-CM | POA: Diagnosis not present

## 2024-03-05 MED ORDER — GABAPENTIN 300 MG PO CAPS: 300.0000 mg | ORAL_CAPSULE | Freq: Two times a day (BID) | ORAL | 2 refills | Status: AC

## 2024-03-05 MED ORDER — TRAMADOL HCL 50 MG PO TABS: 50.0000 mg | ORAL_TABLET | Freq: Two times a day (BID) | ORAL | 1 refills | Status: AC

## 2024-03-05 NOTE — Progress Notes (Signed)
 "  Office Visit  Subjective   Patient ID: Daniel Conner   DOB: Jun 03, 1960   Age: 64 y.o.   MRN: [id number]   Chief Complaint Chief Complaint  Patient presents with   Back Pain    Pain radiates from buttocks to right leg.     History of Present Illness   64 years old male with history of chronic back pain status post 3 back surgeries in the past.  He says that he was hurting so bad that he went to [facility] Emergency room on January 17.  He has a CT scan of his back done and he was told that he has lumbar spinal stenosis and he was started on prednisone , ibuprofen 800 mg 3 times a day and suggested follow-up with Dr. Renato.  He has 3 back surgeries done in [facility] for spinal stenosis.  He says that walking from kitchen to the wound he has to stop because he hurts so much and pain radiate to his right leg.  Pain is there all the time but get worse with movement.    He also has history of diabetes and his hemoglobin A1c was 7.7 in December 2025.  Past Medical History Past Medical History:  Diagnosis Date   Arthritis    Carpal tunnel syndrome    bilateral   ED (erectile dysfunction)    Fatty liver    GERD (gastroesophageal reflux disease)    occ when eat late at night   H/O degenerative disc disease    History of kidney stones    Shortness of breath dyspnea    witrh exertion     Allergies Allergies[1]   Review of Systems Review of Systems  Constitutional: Negative.   Musculoskeletal:  Positive for back pain.       Objective:    Vitals BP (!) 140/90   Pulse 75   Temp 97.6 F (36.4 C)   Resp 18   Ht 5' 11 (1.803 m)   Wt 252 lb 4 oz (114.4 kg)   SpO2 96%   BMI 35.18 kg/m    Physical Examination Physical Exam Constitutional:      Appearance: Normal appearance.  Cardiovascular:     Rate and Rhythm: Normal rate and regular rhythm.     Heart sounds: Normal heart sounds.  Musculoskeletal:        General: No tenderness.  Neurological:     Mental  Status: He is alert.        Assessment & Plan:   Acute right-sided low back pain with right-sided sciatica   He will finish prednisone  course.  He will take gabapentin  300 mg twice a day for 3 days then 3 times a day.  If he has any side effects then he will call.  He will continue with ibuprofen and add Tylenol  Arthritis 3 times a day.  I will also start him on tramadol  50 mg twice a day.  He will call his spine surgeon in [place] for follow-up where he has a surgery done in the past.  Diabetes mellitus with microalbuminuria (HCC)   I have suggested to double the dose of glimepiride  4 mg 2 twice a day while taking prednisone .    No follow-ups on file.   Roetta Dare, MD      [1]  Allergies Allergen Reactions   Hydrocodone Other (See Comments)    Pt states he took cough medicine with hydrocodone and it made him very dizzy and light headed.   "

## 2024-03-05 NOTE — Assessment & Plan Note (Signed)
"    I have suggested to double the dose of glimepiride  4 mg 2 twice a day while taking prednisone . "

## 2024-03-05 NOTE — Assessment & Plan Note (Signed)
"    He will finish prednisone  course.  He will take gabapentin  300 mg twice a day for 3 days then 3 times a day.  If he has any side effects then he will call.  He will continue with ibuprofen and add Tylenol  Arthritis 3 times a day.  I will also start him on tramadol  50 mg twice a day.  He will call his spine surgeon in [place] for follow-up where he has a surgery done in the past. "

## 2024-03-13 ENCOUNTER — Other Ambulatory Visit: Payer: Self-pay | Admitting: Internal Medicine

## 2024-03-15 ENCOUNTER — Other Ambulatory Visit: Payer: Self-pay | Admitting: Internal Medicine

## 2024-04-24 ENCOUNTER — Ambulatory Visit: Admitting: Internal Medicine
